# Patient Record
Sex: Female | Born: 1955 | Race: White | Hispanic: No | Marital: Married | Smoking: Former smoker
Health system: Southern US, Community
[De-identification: ages and names within clinical notes are randomized; demographics above are authoritative.]

## PROBLEM LIST (undated history)

## (undated) DIAGNOSIS — R51 Headache: Secondary | ICD-10-CM

## (undated) DIAGNOSIS — F419 Anxiety disorder, unspecified: Secondary | ICD-10-CM

## (undated) DIAGNOSIS — C801 Malignant (primary) neoplasm, unspecified: Secondary | ICD-10-CM

## (undated) DIAGNOSIS — G43909 Migraine, unspecified, not intractable, without status migrainosus: Secondary | ICD-10-CM

## (undated) DIAGNOSIS — F32A Depression, unspecified: Secondary | ICD-10-CM

## (undated) DIAGNOSIS — K760 Fatty (change of) liver, not elsewhere classified: Secondary | ICD-10-CM

## (undated) DIAGNOSIS — E46 Unspecified protein-calorie malnutrition: Secondary | ICD-10-CM

## (undated) DIAGNOSIS — F329 Major depressive disorder, single episode, unspecified: Secondary | ICD-10-CM

## (undated) DIAGNOSIS — I517 Cardiomegaly: Secondary | ICD-10-CM

## (undated) DIAGNOSIS — K222 Esophageal obstruction: Secondary | ICD-10-CM

## (undated) DIAGNOSIS — F319 Bipolar disorder, unspecified: Secondary | ICD-10-CM

## (undated) DIAGNOSIS — E785 Hyperlipidemia, unspecified: Secondary | ICD-10-CM

## (undated) HISTORY — DX: Depression, unspecified: F32.A

## (undated) HISTORY — DX: Anxiety disorder, unspecified: F41.9

## (undated) HISTORY — DX: Esophageal obstruction: K22.2

## (undated) HISTORY — DX: Unspecified protein-calorie malnutrition: E46

## (undated) HISTORY — DX: Migraine, unspecified, not intractable, without status migrainosus: G43.909

## (undated) HISTORY — PX: OTHER SURGICAL HISTORY: SHX169

## (undated) HISTORY — DX: Fatty (change of) liver, not elsewhere classified: K76.0

## (undated) HISTORY — DX: Major depressive disorder, single episode, unspecified: F32.9

## (undated) HISTORY — DX: Hyperlipidemia, unspecified: E78.5

## (undated) HISTORY — DX: Cardiomegaly: I51.7

## (undated) HISTORY — DX: Malignant (primary) neoplasm, unspecified: C80.1

## (undated) HISTORY — DX: Bipolar disorder, unspecified: F31.9

## (undated) HISTORY — DX: Headache: R51

---
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
## 1977-07-22 HISTORY — PX: CHOLECYSTECTOMY: SHX55

## 1986-07-22 HISTORY — PX: OTHER SURGICAL HISTORY: SHX169

## 1989-07-22 HISTORY — PX: ABDOMINAL HYSTERECTOMY: SHX81

## 1998-08-30 ENCOUNTER — Encounter: Payer: Self-pay | Admitting: Emergency Medicine

## 1998-08-30 ENCOUNTER — Inpatient Hospital Stay (HOSPITAL_COMMUNITY): Admission: EM | Admit: 1998-08-30 | Discharge: 1998-08-31 | Payer: Self-pay | Admitting: Emergency Medicine

## 1998-08-31 ENCOUNTER — Encounter: Payer: Self-pay | Admitting: Emergency Medicine

## 1999-09-12 ENCOUNTER — Emergency Department (HOSPITAL_COMMUNITY): Admission: EM | Admit: 1999-09-12 | Discharge: 1999-09-12 | Payer: Self-pay | Admitting: Emergency Medicine

## 2002-07-22 HISTORY — PX: TONSILLECTOMY AND ADENOIDECTOMY: SUR1326

## 2002-11-07 ENCOUNTER — Emergency Department (HOSPITAL_COMMUNITY): Admission: EM | Admit: 2002-11-07 | Discharge: 2002-11-07 | Payer: Self-pay

## 2002-11-07 ENCOUNTER — Encounter: Payer: Self-pay | Admitting: Emergency Medicine

## 2003-06-10 ENCOUNTER — Encounter (INDEPENDENT_AMBULATORY_CARE_PROVIDER_SITE_OTHER): Payer: Self-pay | Admitting: Specialist

## 2003-06-10 ENCOUNTER — Ambulatory Visit (HOSPITAL_COMMUNITY): Admission: RE | Admit: 2003-06-10 | Discharge: 2003-06-11 | Payer: Self-pay | Admitting: Otolaryngology

## 2005-08-22 DIAGNOSIS — M8580 Other specified disorders of bone density and structure, unspecified site: Secondary | ICD-10-CM | POA: Insufficient documentation

## 2007-07-30 ENCOUNTER — Ambulatory Visit: Payer: Self-pay | Admitting: Internal Medicine

## 2007-07-30 DIAGNOSIS — F329 Major depressive disorder, single episode, unspecified: Secondary | ICD-10-CM

## 2007-07-30 DIAGNOSIS — E785 Hyperlipidemia, unspecified: Secondary | ICD-10-CM

## 2007-07-30 DIAGNOSIS — I10 Essential (primary) hypertension: Secondary | ICD-10-CM | POA: Insufficient documentation

## 2007-07-30 DIAGNOSIS — J069 Acute upper respiratory infection, unspecified: Secondary | ICD-10-CM | POA: Insufficient documentation

## 2007-07-30 DIAGNOSIS — F411 Generalized anxiety disorder: Secondary | ICD-10-CM | POA: Insufficient documentation

## 2007-08-03 ENCOUNTER — Ambulatory Visit: Payer: Self-pay | Admitting: Internal Medicine

## 2007-08-07 ENCOUNTER — Telehealth: Payer: Self-pay | Admitting: Internal Medicine

## 2007-08-07 ENCOUNTER — Encounter (INDEPENDENT_AMBULATORY_CARE_PROVIDER_SITE_OTHER): Payer: Self-pay | Admitting: *Deleted

## 2007-08-12 ENCOUNTER — Telehealth: Payer: Self-pay | Admitting: Internal Medicine

## 2007-10-02 ENCOUNTER — Ambulatory Visit: Payer: Self-pay | Admitting: Internal Medicine

## 2007-10-02 DIAGNOSIS — Z9189 Other specified personal risk factors, not elsewhere classified: Secondary | ICD-10-CM

## 2007-10-04 ENCOUNTER — Encounter: Payer: Self-pay | Admitting: Internal Medicine

## 2007-10-08 ENCOUNTER — Encounter: Payer: Self-pay | Admitting: Internal Medicine

## 2008-08-11 ENCOUNTER — Telehealth: Payer: Self-pay | Admitting: Internal Medicine

## 2008-09-28 ENCOUNTER — Telehealth: Payer: Self-pay | Admitting: Internal Medicine

## 2008-11-19 DIAGNOSIS — G4733 Obstructive sleep apnea (adult) (pediatric): Secondary | ICD-10-CM | POA: Insufficient documentation

## 2009-03-22 HISTORY — PX: OTHER SURGICAL HISTORY: SHX169

## 2009-03-27 DIAGNOSIS — Z9884 Bariatric surgery status: Secondary | ICD-10-CM | POA: Insufficient documentation

## 2010-12-07 NOTE — Op Note (Signed)
NAME:  EDIN, SKARDA                           ACCOUNT NO.:  [id number]   MEDICAL RECORD NO.:  [id number]                   PATIENT TYPE:  OIB   LOCATION:  3310                                 FACILITY:  [facility]   PHYSICIAN:  Zola Button T. Lazarus Salines, M.D.              DATE OF BIRTH:  12/01/1955   DATE OF PROCEDURE:  06/10/2003  DATE OF DISCHARGE:  06/11/2003                                 OPERATIVE REPORT   PREOPERATIVE DIAGNOSES:  1. Nasal septal deviation.  2. Hypertrophic inferior turbinates with obstruction.  3. Cryptic tonsillitis, chronic.  4. Snoring.   POSTOPERATIVE DIAGNOSES:  1. Nasal septal deviation.  2. Hypertrophic inferior turbinates with obstruction.  3. Cryptic tonsillitis, chronic.  4. Snoring.  5. Concha bullosa with obstruction, right middle turbinate.   PROCEDURE:  1. Septoplasty.  2. Bilateral SMR inferior turbinates.  3. Partial excision right middle turbinate.  4. Tonsillectomy.  5. LAUP.   SURGEON:  Gloris Manchester. Lazarus Salines, M.D.   ANESTHESIA:  General orotracheal anesthesia.   ESTIMATED BLOOD LOSS:  25 mL.   COMPLICATIONS:  None.   FINDINGS:  A leftward septal deviation with a severe chondro ethmoid spur  abutting the inferior  turbinate. Left inferior turbinates bilaterally. 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 1+ embedded  fibrotic cryptic tonsils. A long, thin soft palate, especially posteriorly  pillars.   DESCRIPTION OF PROCEDURE:  With the patient in a comfortable supine  position, general orotracheal anesthesia was induced without difficulty. At  an appropriate level, the patient was turned away from anesthesia 90 degrees  and placed in a semi-sitting position. A saline moistened throat pack was  placed. The nasal vibrissae were trimmed.   Cocaine crystals, 200 mg total, were applied on cotton carriers to the  anterior ethmoid and sphenopalatine ganglion regions on both sides. Cocaine  solution, 160 mg total was  applied on 1/2 x 3 inch cottonoids to  both sides  of the septal mucosa. Then 1% Xylocaine with 1:100,000 solution of  epinephrine, 8 mL total was infiltrated in the submucoperichondrial plane of  the septum on both sides, into the membranous columella, into the anterior  floor of the nose, and into the nasal spine region. Several minutes were  allowed for hemostasis and anesthesia to take effect. A sterile preparation  and draping of the midface was accomplished.   The materials were removed from the nose and observed to be intact and  correct in number. The findings were as described above. A small floor  incision was sharply incised on both sides and a right hemitransfixion  incision was incised and communicated with the floor incision. Floor tunnels  were generated on both sides and brought up to the vomer  and carried  forward onto the maxillary crest. The submucoperichondrial plane of the  right septum was elevated up into the perpendicular part of the ethmoid and  brought down across the vomer and communicated with the floor tunnel and  brought forward.   The chondroethmoid junction was identified and opened with the caudal  elevator. The opposite submucoperiosteal plane of the septum was elevated.  The superior perpendicular plate was lysed with an open Jansen-Middleton  forceps. The inferior portion was dissected submucosally, rocked free and  delivered using the closed Jansen-Middleton forceps and also the [place]  forceps. A posterior tag of the quadrangular cartilage was removed. Bone  chips were carefully removed.   The posterior  inferior  corner of the quadrangular cartilage was incised as  well as a 2-mm strip along the inferior edge of the cartilage. This left a 2-  cm dorsal and caudal strut which was straight and with good support. The  maxillary crest posterior to the caudal strut was removed with mallet and  osteotome. Hemostasis was observed. The septum was able  to trapdoor freely  into the midline. It was secured to the nasal spine with a figure-of-8 4-0  PDS suture. The septal tunnel was carefully  suctioned free. The mucosal  incisions were closed with interrupted 4-0 chromic suture.   Just prior to completing the septoplasty, the inferior  turbinates were  infiltrated with 1% Xylocaine with 1:100,000 solution of epinephrine, 6 mL  total. After allowing several minutes for this to take effect, the anterior  hood of the right inferior turbinate was lysed. The medial mucosa was  incised in an anterior upsloping fashion. The laterally based flap was  developed. The turbinate was infractured. Using angled turbinate scissors,  the turbinate bone and lateral mucosa was resected in a posterior  downsloping fashion, taking virtually all of the anterior pole and leaving  virtually all of the posterior  pole. Bony chips were carefully  removed to  allow more prompt healing. The bulbous posterior  pole of the turbinate and  the cut mucosal edges were suction coagulated.   While doing the septoplasty, it was difficult to gain complete access to the  perpendicular plate, at which point, the conchal cell of the right middle  turbinate was discovered. The inferior edge of the turbinate was incised in  a saggital plane, and the lateral  bone and mucosa of the large turbinate  cell was removed using scissors and the open Jansen-Middleton forceps. The  turbinate was outfractured, which allowed better  access to the septum and  more  room in the nose. Hemostasis was spontaneous. Returning to the  inferior  turbinates, the left side was done identically to the right side.   Then 0.040 reinforced Silastic splints were fashioned and placed against the  septum on both sides and secured there too with a 3-0 nylon stitch. Double  thickness Neosporin impregnated Telfa packs were placed along the inferior turbinates on both sides. Then 6.5 mm nasal trumpets were  shortened to reach  into the nasopharynx and were placed, one on each side of the nose to allow  a postoperative airway and to serve as part of the septal packing. This  completed the nasal procedure. Hemostasis was observed.   At this point, the patient was placed flat and then placed in the  Trendelenburg position. Taking care to protect lips, teeth and endotracheal  tube, the Crowe-Davis mouth gag was introduced, expanded for visualization,  and suspended from the Mayo stand in the standard fashion. The findings were  as described above. Then 0.5% Marcaine with 1:200,000 epinephrine, 6 mL  total was infiltrated into the peritonsillar  planes on both sides and into  the soft palate for intraoperative hemostasis. Several minutes were allowed  for this to take effect. A palate retractor and mirror were used to  visualize the nasopharynx with packing visualized but no significant  residual  adenoids.   Beginning on the right side, the tonsil was grasped and retracted medially.  There was copious cryptic  debris. There was moderate fibrosis. Using the  cutting and coagulating cautery, the mucosa overlying the anterior and  superior  pole of the tonsil was coagulated and then cut down to the capsule  of the tonsil. Using the cautery tip as a blunt dissector, lysing fibrous  band and coagulating crossing vessels as identified, the tonsil was  dissected free of its muscular fossa from inferiorly upwards. The tonsil was  removed in its entirety as determined by examination of both tonsil and  fossa. A small additional quantity of cautery rendered the fossa hemostatic.  There was a moderately prominent vein in the midpole which was clamped with  a hemostat and ligated with a 4-0 chromic suture.   The mouth gag was repositioned for access to the left tonsil. The tonsil was  grasped and retracted  medially. Again the mucosa was coagulated and then  cut and the  tonsil was dissected from its  muscular  fossa the same  as on  the right side. This tonsil was slightly smaller and less fibrotic.  Hemostasis was observed.   Upon observing hemostasis in both tonsil beds, the LAUP was begun. All  personnel in the room had protective eye gear. The patient had saline  saturated eye pads placed under  her head  drape, and then  saline saturated  towels placed around  the mouth. The laser was previously tested for aim and  function.   Using the laser in a 10 watt continuous mode with a back drop, a beveled  incision was made along the axis of the uvula up into the soft palate, not  quite 2 cm on each side. The posterior  tonsillar pillar from this beveled  incision downward was resected on both sides. The neo uvula was amputated  approximately  half way up, leaving a longer posterior  flap and an anterior  chevron to allow approximation of the posterior mucosa forward. This was performed using a 4-0 chromic stitch. The same stitch was used along  the  sides of the uvula to reapproximate the mucosa. At this point the LAUP was  completed. The tonsil fossae were hemostatic.   The mouth gag was relaxed for several minutes. Upon reexpansion, hemostasis  was persistent. At this point the procedure was  completed. The  mouth gag  was relaxed and removed. The dental status was intact. The patient was  returned to anesthesia, awakened, extubated and transferred to recovery in  stable condition.   COMMENTS:  A 55 year old white female with a long history of poor nasal  breathing, history of recurrent tonsillitis, a past history of a  peritonsillar  abscess and  a current history of tonsillith debris  and  halitosis were the simple indications for today's procedure. Anticipated  routine postoperative recovery with attention to  analgesia, antibiosis,  hydration, ice and elevation. Will anticipate discharge in 24  hours. Will  remove  the nasal trumpets and  nasal packs prior to  discharge.  Gloris Manchester. Lazarus Salines, M.D.    KTW/MEDQ  D:  06/10/2003  T:  06/11/2003  Job:  [id number]   cc:   Meredith Staggers, M.D.  [address]  [place]  [place] [address]  Fax: [phone]

## 2011-02-17 ENCOUNTER — Emergency Department (HOSPITAL_COMMUNITY)
Admission: EM | Admit: 2011-02-17 | Discharge: 2011-02-17 | Disposition: A | Discharge status: Home / Self Care | Payer: No Typology Code available for payment source | Attending: Emergency Medicine | Admitting: Emergency Medicine

## 2011-02-17 ENCOUNTER — Emergency Department (HOSPITAL_COMMUNITY): Payer: No Typology Code available for payment source

## 2011-02-17 DIAGNOSIS — M79609 Pain in unspecified limb: Secondary | ICD-10-CM | POA: Insufficient documentation

## 2011-02-17 DIAGNOSIS — S5010XA Contusion of unspecified forearm, initial encounter: Secondary | ICD-10-CM | POA: Insufficient documentation

## 2011-02-17 DIAGNOSIS — W2209XA Striking against other stationary object, initial encounter: Secondary | ICD-10-CM | POA: Insufficient documentation

## 2011-02-17 DIAGNOSIS — Y9229 Other specified public building as the place of occurrence of the external cause: Secondary | ICD-10-CM | POA: Insufficient documentation

## 2011-03-08 ENCOUNTER — Emergency Department (HOSPITAL_COMMUNITY): Payer: BC Managed Care – PPO

## 2011-03-08 ENCOUNTER — Emergency Department (HOSPITAL_COMMUNITY)
Admission: EM | Admit: 2011-03-08 | Discharge: 2011-03-08 | Disposition: A | Discharge status: Home / Self Care | Payer: BC Managed Care – PPO | Attending: Emergency Medicine | Admitting: Emergency Medicine

## 2011-03-08 DIAGNOSIS — S1093XA Contusion of unspecified part of neck, initial encounter: Secondary | ICD-10-CM | POA: Insufficient documentation

## 2011-03-08 DIAGNOSIS — E7800 Pure hypercholesterolemia, unspecified: Secondary | ICD-10-CM | POA: Insufficient documentation

## 2011-03-08 DIAGNOSIS — M549 Dorsalgia, unspecified: Secondary | ICD-10-CM | POA: Insufficient documentation

## 2011-03-08 DIAGNOSIS — S0990XA Unspecified injury of head, initial encounter: Secondary | ICD-10-CM | POA: Insufficient documentation

## 2011-03-08 DIAGNOSIS — Z79899 Other long term (current) drug therapy: Secondary | ICD-10-CM | POA: Insufficient documentation

## 2011-03-08 DIAGNOSIS — W010XXA Fall on same level from slipping, tripping and stumbling without subsequent striking against object, initial encounter: Secondary | ICD-10-CM | POA: Insufficient documentation

## 2011-03-08 DIAGNOSIS — S0003XA Contusion of scalp, initial encounter: Secondary | ICD-10-CM | POA: Insufficient documentation

## 2011-03-08 DIAGNOSIS — Y92009 Unspecified place in unspecified non-institutional (private) residence as the place of occurrence of the external cause: Secondary | ICD-10-CM | POA: Insufficient documentation

## 2011-03-08 DIAGNOSIS — K219 Gastro-esophageal reflux disease without esophagitis: Secondary | ICD-10-CM | POA: Insufficient documentation

## 2011-05-16 DIAGNOSIS — R112 Nausea with vomiting, unspecified: Secondary | ICD-10-CM | POA: Insufficient documentation

## 2011-05-16 DIAGNOSIS — E52 Niacin deficiency [pellagra]: Secondary | ICD-10-CM | POA: Insufficient documentation

## 2011-09-11 LAB — HM MAMMOGRAPHY

## 2011-10-01 DIAGNOSIS — N63 Unspecified lump in unspecified breast: Secondary | ICD-10-CM | POA: Insufficient documentation

## 2012-04-30 ENCOUNTER — Encounter: Payer: Self-pay | Admitting: Family Medicine

## 2012-04-30 ENCOUNTER — Ambulatory Visit (INDEPENDENT_AMBULATORY_CARE_PROVIDER_SITE_OTHER): Payer: Self-pay | Admitting: Family Medicine

## 2012-04-30 VITALS — BP 112/68 | Temp 98.6°F | Ht 66.0 in | Wt 124.0 lb

## 2012-04-30 DIAGNOSIS — E785 Hyperlipidemia, unspecified: Secondary | ICD-10-CM

## 2012-04-30 DIAGNOSIS — Z9884 Bariatric surgery status: Secondary | ICD-10-CM

## 2012-04-30 DIAGNOSIS — K922 Gastrointestinal hemorrhage, unspecified: Secondary | ICD-10-CM

## 2012-04-30 DIAGNOSIS — F411 Generalized anxiety disorder: Secondary | ICD-10-CM

## 2012-04-30 DIAGNOSIS — R252 Cramp and spasm: Secondary | ICD-10-CM

## 2012-04-30 LAB — PHOSPHORUS: Phosphorus: 4 mg/dL (ref 2.3–4.6)

## 2012-04-30 LAB — LIPID PANEL
Total CHOL/HDL Ratio: 3
VLDL: 17.4 mg/dL (ref 0.0–40.0)

## 2012-04-30 LAB — COMPREHENSIVE METABOLIC PANEL
Alkaline Phosphatase: 53 U/L (ref 39–117)
BUN: 11 mg/dL (ref 6–23)
Glucose, Bld: 96 mg/dL (ref 70–99)
Total Bilirubin: 0.6 mg/dL (ref 0.3–1.2)

## 2012-04-30 LAB — CBC WITH DIFFERENTIAL/PLATELET
Basophils Relative: 0.5 % (ref 0.0–3.0)
Eosinophils Relative: 4.1 % (ref 0.0–5.0)
HCT: 36 % (ref 36.0–46.0)
Lymphs Abs: 1.3 10*3/uL (ref 0.7–4.0)
MCV: 90 fl (ref 78.0–100.0)
Monocytes Absolute: 0.3 10*3/uL (ref 0.1–1.0)
Neutro Abs: 2.2 10*3/uL (ref 1.4–7.7)
RBC: 4 Mil/uL (ref 3.87–5.11)
WBC: 4 10*3/uL — ABNORMAL LOW (ref 4.5–10.5)

## 2012-04-30 LAB — MAGNESIUM: Magnesium: 2.1 mg/dL (ref 1.5–2.5)

## 2012-04-30 LAB — FERRITIN: Ferritin: 11.7 ng/mL (ref 10.0–291.0)

## 2012-04-30 LAB — VITAMIN B12: Vitamin B-12: 665 pg/mL (ref 211–911)

## 2012-04-30 NOTE — Patient Instructions (Addendum)
-  We have ordered labs or studies at this visit. It usually takes 1-2 weeks for results and processing. We will contact you with instructions IF your results are abnormal. Normal results will be released to your [facility] in 1-2 weeks. If you have not heard from [place] or can not find your results in [facility] in 2 weeks please contact our office.  -please make appointment with me after visit with the gstroenterologist  Thank you for enrolling in MyChart. Please follow the instructions below to securely access your online medical record. MyChart allows you to send messages to your doctor, view your test results, renew your prescriptions, schedule appointments, and more.  How Do I Sign Up? 1. In your Internet browser, go to [email or web address] 2. Click on the New  User? link in the Sign In box.  3. Enter your MyChart Access Code exactly as it appears below. You will not need to use this code after you have completed the sign-up process. If you do not sign up before the expiration date, you must request a new code. MyChart Access Code: [email or web address] Expires: 05/30/2012  8:46 AM  4. Enter the last four digits of your Social Security Number (xxxx) and Date of Birth (mm/dd/yyyy) as indicated and click Next. You will be taken to the next sign-up page. 5. Create a MyChart ID. This will be your MyChart login ID and cannot be changed, so think of one that is secure and easy to remember. 6. Create a MyChart password. You can change your password at any time. 7. Enter your Password Reset Question and Answer and click Next. This can be used at a later time if you forget your password.  8. Select your communication preference, and if applicable enter your e-mail address. You will receive e-mail notification when new information is available in MyChart by choosing to receive e-mail notifications and filling in your e-mail. 9. Click Sign In. You can now view your medical record.   Additional  Information If you have questions, you can email [email or web address] or call [phone] to talk to our MyChart staff. Remember, MyChart is NOT to be used for urgent needs. For medical emergencies, dial 911.

## 2012-04-30 NOTE — Progress Notes (Signed)
Chief Complaint  Patient presents with  . Establish Care    HPI:  BRBPR: -started several months ago -mucus and blood from rectum every day for several months when wipes  -BRB in stools - normal stools, light brown, formed stools -never had a colonoscopy - had gastric bypass in 2010 and lost 150 lbs and now afraid she can not take bowel regimen to do colonoscopy Became anorexic and then on megace briefly earlier this year for a few weeks and gained 20 lbs - this prevented her having to go in hospital for feeding tube -denies fevers, chills, vomiting -has had nausea since surgery [facility] at [facility]) -has been several month since CBC has been checked Has severe osteoporosis - seeing rheum and getting IV reclast Having severe leg cramps for a long time and sometimes in hands - doesn't know what is causing this, these are occuring daily and started after her gastric bypass, she does worry that she is not absorbing something but reports extensive workup by last PCP. Worse aver activity. Cramps occur daily, sometimes feels need to move legs. Hands sometimes feel like they are drawing up. Was on statin and PPI in the past but stopped these one month ago and not better. Magnesium OTC seems to help a little. Voiced frustration with previous PCP. Sees a psychiatrist for anxiety. Has stopped most medications other then lorazepam to sleep. Sees gyn and had physical recently and normal. Had flu vaccine recently.   ROS: See pertinent positives and negatives per HPI.  Past Medical History  Diagnosis Date  . Depression   . Headache     frequent  . GERD (gastroesophageal reflux disease)   . Hyperlipidemia   . Migraine     Family History  Problem Relation Age of Onset  . Arthritis Mother   . Cancer Mother     breast  . Hyperlipidemia Mother   . Alcohol abuse Father   . Arthritis Father   . Heart disease Father   . Stroke Father   . Hypertension Father   . Cancer Maternal  Grandmother   . Arthritis Maternal Grandfather   . Hyperlipidemia Maternal Grandfather   . Alcohol abuse Paternal Grandfather     History   Social History  . Marital Status: Married    Spouse Name: N/A    Number of Children: N/A  . Years of Education: N/A   Social History Main Topics  . Smoking status: None  . Smokeless tobacco: None  . Alcohol Use:   . Drug Use:   . Sexually Active:    Other Topics Concern  . None   Social History Narrative  . None    Current outpatient prescriptions:FLUoxetine HCl 60 MG TABS, Take by mouth every 7 (seven) days., Disp: , Rfl: ;  LORazepam (ATIVAN) 2 MG tablet, Take 2 mg by mouth every 6 (six) hours as needed., Disp: , Rfl:   EXAM:  Filed Vitals:   04/30/12 0809  BP: 112/68  Temp: 98.6 F (37 C)    Body mass index is 20.01 kg/(m^2).  GENERAL: vitals reviewed and listed above, alert, oriented, appears well hydrated and in no acute distress  PSYCH: somewhat anxious and seems frustration  ASSESSMENT AND PLAN:  Discussed the following assessment and plan:  1. GI bleeding  CBC with Differential, CMP, Magnesium, Phosphorus, Ambulatory referral to Gastroenterology  2. Cramps, extremity  CK, Vitamin D, 25-hydroxy  3. HYPERLIPIDEMIA  Lipid Panel  4. ANXIETY    5. Gastric bypass  status for obesity  CBC with Differential, CMP, Magnesium, Phosphorus, Vitamin B12, Ferritin   Complicated patient with multiple problems. Will obtain records. Referred to GI for hematochezia. Labs today per orders. If labs do not uncover obvious reason for muscle cramps also advised ABIs, but pt would prefer to wait until sees GI. Do suspect given extensive recent weight loss and GI issues cramps may be related to malabsorption. Advised to notify bariatric surgeon. Follow up with me after visit with GI.  Patient Instructions  -We have ordered labs or studies at this visit. It usually takes 1-2 weeks for results and processing. We will contact you with  instructions IF your results are abnormal. Normal results will be released to your [facility] in 1-2 weeks. If you have not heard from [place] or can not find your results in [facility] in 2 weeks please contact our office.  -please make appointment with me after visit with the gstroenterologist  Thank you for enrolling in MyChart. Please follow the instructions below to securely access your online medical record. MyChart allows you to send messages to your doctor, view your test results, renew your prescriptions, schedule appointments, and more.  How Do I Sign Up? 1. In your Internet browser, go to [email or web address] 2. Click on the New  User? link in the Sign In box.  3. Enter your MyChart Access Code exactly as it appears below. You will not need to use this code after you have completed the sign-up process. If you do not sign up before the expiration date, you must request a new code. MyChart Access Code: [email or web address] Expires: 05/30/2012  8:46 AM  4. Enter the last four digits of your Social Security Number (xxxx) and Date of Birth (mm/dd/yyyy) as indicated and click Next. You will be taken to the next sign-up page. 5. Create a MyChart ID. This will be your MyChart login ID and cannot be changed, so think of one that is secure and easy to remember. 6. Create a MyChart password. You can change your password at any time. 7. Enter your Password Reset Question and Answer and click Next. This can be used at a later time if you forget your password.  8. Select your communication preference, and if applicable enter your e-mail address. You will receive e-mail notification when new information is available in MyChart by choosing to receive e-mail notifications and filling in your e-mail. 9. Click Sign In. You can now view your medical record.   Additional Information If you have questions, you can email [email or web address] or call [phone] to talk to our MyChart staff.  Remember, MyChart is NOT to be used for urgent needs. For medical emergencies, dial 911.       Kriste Basque R.

## 2012-05-01 LAB — VITAMIN D 25 HYDROXY (VIT D DEFICIENCY, FRACTURES): Vit D, 25-Hydroxy: 31 ng/mL (ref 30–89)

## 2012-05-01 NOTE — Progress Notes (Signed)
Quick Note:  Called and spoke with pt and pt is aware. Pt request labs. Labs mailed to verified address. ______

## 2012-05-05 ENCOUNTER — Telehealth: Payer: Self-pay | Admitting: Family Medicine

## 2012-05-05 MED ORDER — CYCLOBENZAPRINE HCL 5 MG PO TABS: ORAL_TABLET | ORAL | Status: DC

## 2012-05-05 NOTE — Telephone Encounter (Signed)
Pls advise.  

## 2012-05-05 NOTE — Telephone Encounter (Signed)
Caller: Jaysha/Patient; Phone: [phone]; Reason for Call: Patient states she was seen by Dr.  Selena Batten the other day, wants to see if Dr.  Selena Batten would call in something for muscle spasms in her hands & legs.  Please call patient back.  Thanks

## 2012-05-05 NOTE — Telephone Encounter (Signed)
Will send flexeril to pharmacy for temporary use when severe. If continued cramps despite ferritin use, I would recommend she follow up with me to discuss other options.

## 2012-05-05 NOTE — Telephone Encounter (Signed)
Caller: Sharline/Patient; Patient Name: Misty Howard; PCP: Kriste Basque [facility]); Best Callback Phone Number: [phone] Patient is calling about had called office earlier 05/05/12 wanting to know if Dr. Selena Batten would call in something for muscle spasms of hands and legs and had not heard anything from office yet.   Advised patient that note is in the computer, doctors are still seeing patients, and it will be addressed when done with patients for the day.

## 2012-05-06 ENCOUNTER — Telehealth: Payer: Self-pay | Admitting: Family Medicine

## 2012-05-06 NOTE — Telephone Encounter (Signed)
Called pt and advised of appt with Dr. Logan Bores at [facility] on Dec. 6.2013 at 10:30 am.  Pt given address of [address]  [place] [address] [address] [facility].  Pt given telephone information as well.

## 2012-05-06 NOTE — Telephone Encounter (Signed)
Note duplicated message.

## 2012-05-06 NOTE — Telephone Encounter (Signed)
Called pt to make aware and pt states she is very upset.  Pt states that she received a call last night that her Flexeril had been called in.  Advised pt of Dr. Elmyra Ricks recommendations and pt is aware.  Pt states the other option is a referral to GI.  Called Joni Reining and checked on referral.  Per Joni Reining referral was sent to [facility].  Asked Joni Reining to call and check the status of pt getting an appt.  Advised pt she would receive a call back today on status.

## 2012-05-06 NOTE — Telephone Encounter (Signed)
Caller: Ricci/Patient; Patient Name: Misty Howard; PCP: Kriste Basque [facility]); Best Callback Phone Number: [phone].  Pt calling back today 05/06/12 regarding previous calls from yesterday.  Pt very upset because nobody from office ever called her back.  Triager pulled chart in EPIC and reviewed note with pt from Dr. Selena Batten written yesterday at 5:31 PM, "Will send flexeril to pharmacy for temporary use when severe. If continued cramps despite ferritin use, I would recommend she follow up with me to discuss other options."  Triager advised pt that MD called in Flexeril to [facility].  Pt very upset that nobody from office bothered to call her back to tell her this was called in.

## 2012-05-14 ENCOUNTER — Encounter: Payer: Self-pay | Admitting: Family Medicine

## 2012-05-18 ENCOUNTER — Encounter: Payer: Self-pay | Admitting: Family Medicine

## 2012-05-18 DIAGNOSIS — M810 Age-related osteoporosis without current pathological fracture: Secondary | ICD-10-CM | POA: Insufficient documentation

## 2012-05-18 DIAGNOSIS — Z8659 Personal history of other mental and behavioral disorders: Secondary | ICD-10-CM | POA: Insufficient documentation

## 2012-05-18 NOTE — Progress Notes (Signed)
Records reviewed from previous PCP. PMH, Problem list and Health maintenance updated. Of note at OV 10/2011, concern for anorexia per notes. Work up for cramps with extensive lab work and felt to be related to [employer].  Referred by previous PCP to GI in 12/2011.

## 2012-05-25 ENCOUNTER — Telehealth: Payer: Self-pay | Admitting: Family Medicine

## 2012-05-25 NOTE — Telephone Encounter (Signed)
Patient calling back.  Continues to have cramping in her hands and feet.  She completed the Flexoril given with no improvement.  States that she wakes up at night and walks the floor and crys in the pain.  States that her hands /fingers twist.   She has been scheduled to see a GI specialist, Dec 6 appt date.   Wanting to know what the next plan of treatment will be?

## 2012-05-25 NOTE — Telephone Encounter (Signed)
Would advise appointment for this.

## 2012-05-25 NOTE — Telephone Encounter (Signed)
Pls advise.  

## 2012-05-26 NOTE — Telephone Encounter (Signed)
Left a message for return call.  

## 2012-05-26 NOTE — Telephone Encounter (Signed)
Pt is sch for tomorrow °

## 2012-05-27 ENCOUNTER — Encounter: Payer: Self-pay | Admitting: Family Medicine

## 2012-05-27 ENCOUNTER — Ambulatory Visit (INDEPENDENT_AMBULATORY_CARE_PROVIDER_SITE_OTHER): Payer: BC Managed Care – PPO | Admitting: Family Medicine

## 2012-05-27 VITALS — BP 108/78 | HR 101 | Temp 98.4°F | Wt 125.0 lb

## 2012-05-27 DIAGNOSIS — R252 Cramp and spasm: Secondary | ICD-10-CM

## 2012-05-27 NOTE — Patient Instructions (Signed)
-  it will be very important to increase hydration - 2 large bottle of watered down Gatorade daily at minimum  -heat and then sustained stretching every evening before bed  -regular sustained exercise - 20- 30 minutes daily  -1000mg  calcium and 1000IU vitamin D daily  -continue the iron and vitamin C  -continue to make sure you are eating well  -follow up in one month

## 2012-05-27 NOTE — Progress Notes (Signed)
Chief Complaint  Patient presents with  . cramping in hands and feet    HPI:  Cramps in hands and feet: -started about 1 year after gastric bypass surgery -had had lab workups in the past and has been felt to be malnutrition as pt los a very large amount of weight -LFTs, calcium, magnesium and potassium are normal -patient repots TSH done recently and normal -her ferritin is low and she is mildly anemic and is scheduled to see GI for LGI bleeding (bleeding has improved) -description of symptoms: only occur at rest and almost always at night, occurs in hands and in feet and calves - occurs usually about daily, muscles draw up and then it takes a  Long time of stretching to get cramp to release -diet:yesterday: yogurt for breakfast, [employer] chicken wrap, frozen dinner ([employer] [place]) - she is trying to eat well and weight is now up to 125 (gastric surgeon follows her closely and wants her to be at 130lbs) -B vitaimins: taking B12 -does not drink much, hates to have to pee -taking: taking the vitamin C and iron as instructed -is taking reclast for severe osteoporosis, Dr. Armanda Magic  - but thinks started this medication after cramps started She has tried:   ROS: See pertinent positives and negatives per HPI.  Past Medical History  Diagnosis Date  . Depression   . Headache     frequent  . GERD (gastroesophageal reflux disease)   . Hyperlipidemia   . Migraine   . Bipolar disorder   . OSA (obstructive sleep apnea)   . Gastric ulcer   . Hepatic steatosis   . Anxiety   . Esophageal ring     s/p dilation 2003  . Protein calorie malnutrition   . LAE (left atrial enlargement)     per records on echo in 2008    Family History  Problem Relation Age of Onset  . Arthritis Mother   . Cancer Mother     breast  . Hyperlipidemia Mother   . Alcohol abuse Father   . Arthritis Father   . Heart disease Father   . Stroke Father   . Hypertension Father   . Cancer Maternal  Grandmother   . Arthritis Maternal Grandfather   . Hyperlipidemia Maternal Grandfather   . Alcohol abuse Paternal Grandfather     History   Social History  . Marital Status: Married    Spouse Name: N/A    Number of Children: N/A  . Years of Education: N/A   Social History Main Topics  . Smoking status: Former Games developer  . Smokeless tobacco: None  . Alcohol Use: Yes  . Drug Use: None  . Sexually Active: None   Other Topics Concern  . None   Social History Narrative  . None    Current outpatient prescriptions:cyclobenzaprine (FLEXERIL) 5 MG tablet, Can use one tablet up to twice daily only if needed for back spasms., Disp: 30 tablet, Rfl: 0;  ferrous fumarate (HEMOCYTE - 106 MG FE) 325 (106 FE) MG TABS, Take 1 tablet by mouth., Disp: , Rfl: ;  FLUoxetine HCl 60 MG TABS, Take by mouth every 7 (seven) days., Disp: , Rfl: ;  LORazepam (ATIVAN) 2 MG tablet, Take 2 mg by mouth every 6 (six) hours as needed., Disp: , Rfl:  magnesium 30 MG tablet, Take 30 mg by mouth 2 (two) times daily., Disp: , Rfl: ;  Multiple Vitamin (MULTIVITAMIN) tablet, Take 1 tablet by mouth daily., Disp: , Rfl: ;  promethazine (PHENERGAN) 25 MG tablet, Take 25 mg by mouth every 6 (six) hours as needed., Disp: , Rfl: ;  vitamin B-12 (CYANOCOBALAMIN) 250 MCG tablet, Take 250 mcg by mouth daily., Disp: , Rfl:   EXAM:  Filed Vitals:   05/27/12 0805  BP: 108/78  Pulse: 101  Temp: 98.4 F (36.9 C)    There is no height on file to calculate BMI.  GENERAL: vitals reviewed and listed above, alert, oriented, appears well hydrated and in no acute distress  HEENT: atraumatic, conjunttiva clear, no obvious abnormalities on inspection of external nose and ears  NECK: no obvious masses on inspection  LUNGS: clear to auscultation bilaterally, no wheezes, rales or rhonchi, good air movement  CV: HRRR, no peripheral edema  MS/NEURO: moves all extremities without noticeable abnormality, scars on R UE from extensive  prior surgery, normal muscle strength, DTRs and sensation to light touch throughout, no atrophy or spasm of muscles, good pulses throughout  PSYCH: pleasant and cooperative, no obvious depression or anxiety  ASSESSMENT AND PLAN:  Discussed the following assessment and plan:  1. Nocturnal muscle cramps    -longstanding resting muscle cramps throughout extremities with numerous possible etiologies though likely related to poor nutrition or electrolyte abnormalities as muscle tissue level and dehydration as pt does not drink enough fluids -I also advised ensuring adequate hydration and stressed the importance of proper nutrition and regular exercise and stretching and heat -I also think adding calcium 1000mg  daily may be helpful and vitamin D 1000 IU daily is worth trying -we discussed quinine, and tonic water before bedtime may be useful -we discussed doing EMG or a neurology referral, but pt refuses at this time -reclast can cause severe muscle pain and spasm and this could be contributing or the cause - i have advised her to talk with her rheumatologist -follow up in 1 month  -Patient advised to return or notify a doctor immediately if symptoms worsen or persist or new concerns arise.  Patient Instructions  -it will be very important to increase hydration - 2 large bottle of watered down Gatorade daily at minimum  -heat and then sustained stretching every evening before bed  -regular sustained exercise - 20- 30 minutes daily  -1000mg  calcium and 1000IU vitamin D daily  -continue the iron and vitamin C  -continue to make sure you are eating well  -follow up in one month     KIM, HANNAH R.

## 2012-06-29 ENCOUNTER — Ambulatory Visit: Payer: BC Managed Care – PPO | Admitting: Family Medicine

## 2012-10-16 DIAGNOSIS — K5900 Constipation, unspecified: Secondary | ICD-10-CM | POA: Insufficient documentation

## 2013-02-01 ENCOUNTER — Telehealth: Payer: Self-pay | Admitting: Family Medicine

## 2013-02-01 DIAGNOSIS — R252 Cramp and spasm: Secondary | ICD-10-CM

## 2013-02-01 NOTE — Telephone Encounter (Signed)
pls advise

## 2013-02-01 NOTE — Telephone Encounter (Signed)
Misty Howard S/patient Phone: [phone].  Called to ask if Dr Selena Batten would order Neurontin 300 mg for intermittent leg cramps with toe contractions. Declined triage.  Reports MD ran all sorts of tests in October 2013; tried Calcium, Vit C and iron. Under care of MD at [facility] for colon cancer.  Tried Neurontin for 3 nights and it  worked well. Declined to say how she got the medication.  History of gastric bypass; wants to have MD confirm that medication will be safe to use. [employer] in [employer] [phone].

## 2013-02-02 MED ORDER — GABAPENTIN 300 MG PO CAPS: 300.0000 mg | ORAL_CAPSULE | Freq: Every evening | ORAL | Status: DC | PRN

## 2013-02-02 NOTE — Telephone Encounter (Signed)
Already had placed order.

## 2013-02-02 NOTE — Telephone Encounter (Signed)
Called and spoke with pt and pt is aware.  Pls give qty and refills please.  Thanks!

## 2013-02-02 NOTE — Telephone Encounter (Signed)
Ok  To try this once nightly when cramps bed. But, this medication can also CAUSE leg cramps. Would advise she see neurology for ongoing cramps. Please ask if she would like this referral. Should talk with pharmacist about side effects and adverse reactions associated with this medication.

## 2013-04-08 ENCOUNTER — Telehealth: Payer: Self-pay | Admitting: Family Medicine

## 2013-04-08 DIAGNOSIS — R252 Cramp and spasm: Secondary | ICD-10-CM

## 2013-04-08 MED ORDER — GABAPENTIN 300 MG PO CAPS: 300.0000 mg | ORAL_CAPSULE | Freq: Every evening | ORAL | Status: DC | PRN

## 2013-04-08 NOTE — Telephone Encounter (Signed)
Samples request

## 2013-04-08 NOTE — Telephone Encounter (Signed)
Rx sent to pharmacy   

## 2013-04-08 NOTE — Telephone Encounter (Signed)
Unfortunately our office does not have samples of neurotin.  Pt would like a refill sent to [facility]. Pls advise.

## 2013-04-08 NOTE — Telephone Encounter (Signed)
Fine to refill for 6 months

## 2013-04-08 NOTE — Telephone Encounter (Signed)
Patient Information:  Caller Name: [name]  Phone: [phone]  Patient: Misty Howard, Misty Howard  Gender: Female  DOB: March 20, 1956  Age: 57 Years  PCP: Kriste Basque [facility])  Office Follow Up:  Does the office need to follow up with this patient?: Yes  Instructions For The Office: Pt requesting 2 more refills for her Neurontin.   Symptoms  Reason For Call & Symptoms: Pt is taking Neurontin 300 mg 1 QD for leg pain at night .  She states this is working very well for her and she would like to get 2 refills.  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  She has about 6 pills left from second refill from RX written 02/02/13.  ANY SAMPLES WOULD BE GREATLY APPRECIATED.   Reviewed Health History In EMR: N/A  Reviewed Medications In EMR: N/A  Reviewed Allergies In EMR: N/A  Reviewed Surgeries / Procedures: Yes  Date of Onset of Symptoms: Unknown  Treatments Tried: Neurontin 300 mg 1 PO QD @ HS  Treatments Tried Worked: Yes  Guideline(s) Used:  No Protocol Available - Information Only  Disposition Per Guideline:   Home Care  Reason For Disposition Reached:   Information only question and nurse able to answer  Advice Given:  N/A  Patient Will Follow Care Advice:  YES - ANY SAMPLES WOULD BE GREATLY APPRECIATED.

## 2013-04-08 NOTE — Addendum Note (Signed)
Addended by: Azucena Freed on: 04/08/2013 02:17 PM   Modules accepted: Orders

## 2013-12-31 ENCOUNTER — Emergency Department: Payer: Self-pay | Admitting: Emergency Medicine

## 2014-04-26 ENCOUNTER — Other Ambulatory Visit: Payer: Self-pay | Admitting: Family Medicine

## 2015-12-19 ENCOUNTER — Emergency Department: Payer: Worker's Compensation

## 2015-12-19 ENCOUNTER — Encounter: Payer: Self-pay | Admitting: Emergency Medicine

## 2015-12-19 ENCOUNTER — Emergency Department
Admission: EM | Admit: 2015-12-19 | Discharge: 2015-12-19 | Disposition: A | Discharge status: Home / Self Care | Payer: Worker's Compensation | Attending: Emergency Medicine | Admitting: Emergency Medicine

## 2015-12-19 DIAGNOSIS — Z87891 Personal history of nicotine dependence: Secondary | ICD-10-CM | POA: Insufficient documentation

## 2015-12-19 DIAGNOSIS — Y92129 Unspecified place in nursing home as the place of occurrence of the external cause: Secondary | ICD-10-CM | POA: Insufficient documentation

## 2015-12-19 DIAGNOSIS — Y999 Unspecified external cause status: Secondary | ICD-10-CM | POA: Insufficient documentation

## 2015-12-19 DIAGNOSIS — W503XXA Accidental bite by another person, initial encounter: Secondary | ICD-10-CM | POA: Diagnosis not present

## 2015-12-19 DIAGNOSIS — F329 Major depressive disorder, single episode, unspecified: Secondary | ICD-10-CM | POA: Insufficient documentation

## 2015-12-19 DIAGNOSIS — S61259A Open bite of unspecified finger without damage to nail, initial encounter: Secondary | ICD-10-CM

## 2015-12-19 DIAGNOSIS — Y939 Activity, unspecified: Secondary | ICD-10-CM | POA: Diagnosis not present

## 2015-12-19 DIAGNOSIS — S61251A Open bite of left index finger without damage to nail, initial encounter: Secondary | ICD-10-CM | POA: Insufficient documentation

## 2015-12-19 DIAGNOSIS — E785 Hyperlipidemia, unspecified: Secondary | ICD-10-CM | POA: Insufficient documentation

## 2015-12-19 DIAGNOSIS — Z79899 Other long term (current) drug therapy: Secondary | ICD-10-CM | POA: Insufficient documentation

## 2015-12-19 MED ORDER — TETANUS-DIPHTH-ACELL PERTUSSIS 5-2.5-18.5 LF-MCG/0.5 IM SUSP
0.5000 mL | Freq: Once | INTRAMUSCULAR | Status: AC
  Administered 2015-12-19: 0.5 mL via INTRAMUSCULAR

## 2015-12-19 MED ORDER — AMOXICILLIN-POT CLAVULANATE 875-125 MG PO TABS: 1.0000 | ORAL_TABLET | Freq: Two times a day (BID) | ORAL | Status: AC
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
## 2015-12-19 NOTE — ED Notes (Signed)
WC completed and delivered to lab for courier p/u. 
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
## 2015-12-19 NOTE — ED Notes (Signed)
Pt placed on med hold at this time, pt made aware and verbalized understanding at this time.
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
## 2015-12-21 DIAGNOSIS — K56699 Other intestinal obstruction unspecified as to partial versus complete obstruction: Secondary | ICD-10-CM | POA: Insufficient documentation

## 2015-12-21 DIAGNOSIS — K9189 Other postprocedural complications and disorders of digestive system: Secondary | ICD-10-CM | POA: Insufficient documentation

## 2018-04-07 DIAGNOSIS — E46 Unspecified protein-calorie malnutrition: Secondary | ICD-10-CM | POA: Insufficient documentation

## 2018-04-07 DIAGNOSIS — E860 Dehydration: Secondary | ICD-10-CM | POA: Insufficient documentation

## 2018-04-11 ENCOUNTER — Encounter: Payer: Self-pay | Admitting: Behavioral Health

## 2018-04-11 DIAGNOSIS — Z9884 Bariatric surgery status: Secondary | ICD-10-CM | POA: Insufficient documentation

## 2018-04-11 DIAGNOSIS — G43909 Migraine, unspecified, not intractable, without status migrainosus: Secondary | ICD-10-CM | POA: Insufficient documentation

## 2018-04-11 DIAGNOSIS — C189 Malignant neoplasm of colon, unspecified: Secondary | ICD-10-CM | POA: Insufficient documentation

## 2018-04-28 ENCOUNTER — Ambulatory Visit: Payer: Self-pay | Admitting: Psychiatry

## 2018-05-18 ENCOUNTER — Other Ambulatory Visit: Payer: Self-pay

## 2018-05-18 MED ORDER — FLUOXETINE HCL 20 MG PO CAPS: 60.0000 mg | ORAL_CAPSULE | Freq: Every day | ORAL | 0 refills | Status: DC

## 2018-06-09 ENCOUNTER — Other Ambulatory Visit: Payer: Self-pay

## 2018-06-09 MED ORDER — ARIPIPRAZOLE 2 MG PO TABS: 2.0000 mg | ORAL_TABLET | Freq: Every day | ORAL | 0 refills | Status: DC

## 2018-06-11 ENCOUNTER — Other Ambulatory Visit: Payer: Self-pay

## 2018-06-15 ENCOUNTER — Other Ambulatory Visit: Payer: Self-pay

## 2018-06-15 MED ORDER — FLUOXETINE HCL 20 MG PO CAPS: 60.0000 mg | ORAL_CAPSULE | Freq: Every day | ORAL | 0 refills | Status: DC

## 2018-07-29 ENCOUNTER — Telehealth: Payer: Self-pay | Admitting: Psychiatry

## 2018-07-29 NOTE — Telephone Encounter (Signed)
PATIENT WENT TO PHARMACY TO PICK UP ADIVANT AND DID NOT HAVE A REFILL WANTS TO KNOW IF SHE CAN JUST GET ENOUGH FOR TONIGHT, PLEASE SEND TO [place]

## 2018-07-29 NOTE — Telephone Encounter (Signed)
Lorazepam 1mg  #10 called into [place] till appt 07/31/2018

## 2018-07-31 ENCOUNTER — Ambulatory Visit (INDEPENDENT_AMBULATORY_CARE_PROVIDER_SITE_OTHER): Payer: BLUE CROSS/BLUE SHIELD | Admitting: Psychiatry

## 2018-07-31 ENCOUNTER — Encounter: Payer: Self-pay | Admitting: Psychiatry

## 2018-07-31 VITALS — BP 137/76 | HR 83

## 2018-07-31 DIAGNOSIS — F5105 Insomnia due to other mental disorder: Secondary | ICD-10-CM

## 2018-07-31 DIAGNOSIS — F39 Unspecified mood [affective] disorder: Secondary | ICD-10-CM

## 2018-07-31 DIAGNOSIS — F411 Generalized anxiety disorder: Secondary | ICD-10-CM

## 2018-07-31 DIAGNOSIS — F99 Mental disorder, not otherwise specified: Secondary | ICD-10-CM | POA: Diagnosis not present

## 2018-07-31 MED ORDER — LORAZEPAM 1 MG PO TABS: 2.5000 mg | ORAL_TABLET | Freq: Every day | ORAL | 5 refills | Status: DC

## 2018-07-31 MED ORDER — FLUOXETINE HCL 20 MG PO CAPS: 60.0000 mg | ORAL_CAPSULE | Freq: Every day | ORAL | 1 refills | Status: DC

## 2018-07-31 MED ORDER — ARIPIPRAZOLE 2 MG PO TABS: 2.0000 mg | ORAL_TABLET | Freq: Every day | ORAL | 1 refills | Status: DC

## 2018-07-31 MED ORDER — TOPIRAMATE 100 MG PO TABS: 200.0000 mg | ORAL_TABLET | Freq: Every day | ORAL | 1 refills | Status: DC
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
## 2018-09-10 ENCOUNTER — Telehealth: Payer: Self-pay | Admitting: Psychiatry

## 2018-09-10 NOTE — Telephone Encounter (Signed)
Nava called to says she is about to take a job in [place] that will last about 3 months.  Please call to discuss medicine refills while she is gone.  She does have an appt to f/u in July.

## 2018-09-11 NOTE — Telephone Encounter (Signed)
Pt aware and instructed to call with any problems or concerns.  Asking to have rx sent locally to [facility] to have on file.

## 2018-10-05 ENCOUNTER — Other Ambulatory Visit: Payer: Self-pay | Admitting: Psychiatry

## 2018-10-05 DIAGNOSIS — F5105 Insomnia due to other mental disorder: Secondary | ICD-10-CM

## 2018-10-05 DIAGNOSIS — F99 Mental disorder, not otherwise specified: Principal | ICD-10-CM

## 2018-10-05 MED ORDER — LORAZEPAM 1 MG PO TABS: 2.5000 mg | ORAL_TABLET | Freq: Every day | ORAL | 1 refills | Status: DC

## 2018-10-05 NOTE — Telephone Encounter (Signed)
Misty Howard called requesting refill on her lorazepam.  She is out of state for the next 3 months.  Returns about 12/12/18.  Please send to [facility] in [place], [place].  Phone [phone] or Fax [phone].  Her next appt is 02/08/19

## 2019-01-21 ENCOUNTER — Inpatient Hospital Stay (HOSPITAL_COMMUNITY)
Admission: EM | Admit: 2019-01-21 | Discharge: 2019-01-25 | DRG: 482 | Disposition: A | Discharge status: Home Health | Payer: 59 | Attending: Family Medicine | Admitting: Family Medicine

## 2019-01-21 ENCOUNTER — Emergency Department (HOSPITAL_COMMUNITY): Payer: 59

## 2019-01-21 ENCOUNTER — Encounter (HOSPITAL_COMMUNITY): Payer: Self-pay | Admitting: Emergency Medicine

## 2019-01-21 ENCOUNTER — Other Ambulatory Visit: Payer: Self-pay

## 2019-01-21 DIAGNOSIS — S72145A Nondisplaced intertrochanteric fracture of left femur, initial encounter for closed fracture: Principal | ICD-10-CM

## 2019-01-21 DIAGNOSIS — W109XXA Fall (on) (from) unspecified stairs and steps, initial encounter: Secondary | ICD-10-CM | POA: Diagnosis present

## 2019-01-21 DIAGNOSIS — F319 Bipolar disorder, unspecified: Secondary | ICD-10-CM | POA: Diagnosis present

## 2019-01-21 DIAGNOSIS — E785 Hyperlipidemia, unspecified: Secondary | ICD-10-CM | POA: Diagnosis present

## 2019-01-21 DIAGNOSIS — Z886 Allergy status to analgesic agent status: Secondary | ICD-10-CM

## 2019-01-21 DIAGNOSIS — Z885 Allergy status to narcotic agent status: Secondary | ICD-10-CM

## 2019-01-21 DIAGNOSIS — F4310 Post-traumatic stress disorder, unspecified: Secondary | ICD-10-CM | POA: Diagnosis present

## 2019-01-21 DIAGNOSIS — S72002A Fracture of unspecified part of neck of left femur, initial encounter for closed fracture: Secondary | ICD-10-CM

## 2019-01-21 DIAGNOSIS — S72142A Displaced intertrochanteric fracture of left femur, initial encounter for closed fracture: Secondary | ICD-10-CM

## 2019-01-21 DIAGNOSIS — Z87891 Personal history of nicotine dependence: Secondary | ICD-10-CM

## 2019-01-21 DIAGNOSIS — K760 Fatty (change of) liver, not elsewhere classified: Secondary | ICD-10-CM | POA: Diagnosis present

## 2019-01-21 DIAGNOSIS — Z8249 Family history of ischemic heart disease and other diseases of the circulatory system: Secondary | ICD-10-CM

## 2019-01-21 DIAGNOSIS — I10 Essential (primary) hypertension: Secondary | ICD-10-CM | POA: Diagnosis present

## 2019-01-21 DIAGNOSIS — Z888 Allergy status to other drugs, medicaments and biological substances status: Secondary | ICD-10-CM

## 2019-01-21 DIAGNOSIS — Z803 Family history of malignant neoplasm of breast: Secondary | ICD-10-CM

## 2019-01-21 DIAGNOSIS — D649 Anemia, unspecified: Secondary | ICD-10-CM | POA: Diagnosis present

## 2019-01-21 DIAGNOSIS — F32A Depression, unspecified: Secondary | ICD-10-CM | POA: Diagnosis present

## 2019-01-21 DIAGNOSIS — Z811 Family history of alcohol abuse and dependence: Secondary | ICD-10-CM

## 2019-01-21 DIAGNOSIS — Z8261 Family history of arthritis: Secondary | ICD-10-CM

## 2019-01-21 DIAGNOSIS — Z8349 Family history of other endocrine, nutritional and metabolic diseases: Secondary | ICD-10-CM

## 2019-01-21 DIAGNOSIS — S72009A Fracture of unspecified part of neck of unspecified femur, initial encounter for closed fracture: Secondary | ICD-10-CM | POA: Diagnosis not present

## 2019-01-21 DIAGNOSIS — Z1159 Encounter for screening for other viral diseases: Secondary | ICD-10-CM

## 2019-01-21 DIAGNOSIS — Z818 Family history of other mental and behavioral disorders: Secondary | ICD-10-CM

## 2019-01-21 DIAGNOSIS — G43909 Migraine, unspecified, not intractable, without status migrainosus: Secondary | ICD-10-CM | POA: Diagnosis present

## 2019-01-21 DIAGNOSIS — Z9884 Bariatric surgery status: Secondary | ICD-10-CM

## 2019-01-21 DIAGNOSIS — Z823 Family history of stroke: Secondary | ICD-10-CM

## 2019-01-21 DIAGNOSIS — F329 Major depressive disorder, single episode, unspecified: Secondary | ICD-10-CM | POA: Diagnosis present

## 2019-01-21 DIAGNOSIS — Z01818 Encounter for other preprocedural examination: Secondary | ICD-10-CM

## 2019-01-21 LAB — CBC WITH DIFFERENTIAL/PLATELET
Abs Immature Granulocytes: 0.04 10*3/uL (ref 0.00–0.07)
Basophils Absolute: 0 10*3/uL (ref 0.0–0.1)
Basophils Relative: 0 %
Eosinophils Absolute: 0 10*3/uL (ref 0.0–0.5)
Eosinophils Relative: 0 %
HCT: 36.3 % (ref 36.0–46.0)
Hemoglobin: 11.4 g/dL — ABNORMAL LOW (ref 12.0–15.0)
Immature Granulocytes: 1 %
Lymphocytes Relative: 10 %
Lymphs Abs: 0.8 10*3/uL (ref 0.7–4.0)
MCH: 30.5 pg (ref 26.0–34.0)
MCHC: 31.4 g/dL (ref 30.0–36.0)
MCV: 97.1 fL (ref 80.0–100.0)
Monocytes Absolute: 0.4 10*3/uL (ref 0.1–1.0)
Monocytes Relative: 5 %
Neutro Abs: 6.4 10*3/uL (ref 1.7–7.7)
Neutrophils Relative %: 84 %
Platelets: 271 10*3/uL (ref 150–400)
RBC: 3.74 MIL/uL — ABNORMAL LOW (ref 3.87–5.11)
RDW: 13.2 % (ref 11.5–15.5)
WBC: 7.7 10*3/uL (ref 4.0–10.5)
nRBC: 0 % (ref 0.0–0.2)

## 2019-01-21 MED ORDER — FENTANYL CITRATE (PF) 100 MCG/2ML IJ SOLN
50.0000 ug | Freq: Once | INTRAMUSCULAR | Status: AC
  Administered 2019-01-21: 50 ug via INTRAVENOUS
  Filled 2019-01-21: qty 2

## 2019-01-21 MED ORDER — ONDANSETRON HCL 4 MG/2ML IJ SOLN
4.0000 mg | Freq: Once | INTRAMUSCULAR | Status: AC
  Administered 2019-01-21: 4 mg via INTRAVENOUS
  Filled 2019-01-21: qty 2

## 2019-01-21 NOTE — ED Notes (Signed)
Patient transported to X-ray 

## 2019-01-21 NOTE — ED Notes (Signed)
Bed: WA25 Expected date:  Expected time:  Means of arrival:  Comments: 

## 2019-01-21 NOTE — ED Triage Notes (Signed)
[facility] transported pt from home to [facility] ED and reports the following:  Pt had a mechanical fall on left hip. Fell down 3-4 steps. Upon EMS arrived denies head, neck, back pain. Left hip deformity. Left leg inward rotation and elongation. Femur, tiba-fib intact.

## 2019-01-21 NOTE — ED Notes (Signed)
Pt oxygen dropping to 84-85% when she goes to sleep. Applied 2 L O2.
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
## 2019-01-22 ENCOUNTER — Inpatient Hospital Stay (HOSPITAL_COMMUNITY): Payer: 59

## 2019-01-22 ENCOUNTER — Inpatient Hospital Stay (HOSPITAL_COMMUNITY): Payer: 59 | Admitting: Anesthesiology

## 2019-01-22 ENCOUNTER — Encounter (HOSPITAL_COMMUNITY): Payer: Self-pay | Admitting: Internal Medicine

## 2019-01-22 ENCOUNTER — Emergency Department (HOSPITAL_COMMUNITY): Payer: 59

## 2019-01-22 ENCOUNTER — Encounter (HOSPITAL_COMMUNITY)
Admission: EM | Disposition: A | Discharge status: Home Health | Payer: Self-pay | Source: Home / Self Care | Attending: Family Medicine

## 2019-01-22 DIAGNOSIS — Z818 Family history of other mental and behavioral disorders: Secondary | ICD-10-CM | POA: Diagnosis not present

## 2019-01-22 DIAGNOSIS — K760 Fatty (change of) liver, not elsewhere classified: Secondary | ICD-10-CM | POA: Diagnosis present

## 2019-01-22 DIAGNOSIS — Z886 Allergy status to analgesic agent status: Secondary | ICD-10-CM | POA: Diagnosis not present

## 2019-01-22 DIAGNOSIS — S72002A Fracture of unspecified part of neck of left femur, initial encounter for closed fracture: Secondary | ICD-10-CM

## 2019-01-22 DIAGNOSIS — D649 Anemia, unspecified: Secondary | ICD-10-CM | POA: Diagnosis present

## 2019-01-22 DIAGNOSIS — Z8349 Family history of other endocrine, nutritional and metabolic diseases: Secondary | ICD-10-CM | POA: Diagnosis not present

## 2019-01-22 DIAGNOSIS — Z8249 Family history of ischemic heart disease and other diseases of the circulatory system: Secondary | ICD-10-CM | POA: Diagnosis not present

## 2019-01-22 DIAGNOSIS — F4310 Post-traumatic stress disorder, unspecified: Secondary | ICD-10-CM | POA: Diagnosis present

## 2019-01-22 DIAGNOSIS — Z811 Family history of alcohol abuse and dependence: Secondary | ICD-10-CM | POA: Diagnosis not present

## 2019-01-22 DIAGNOSIS — Z888 Allergy status to other drugs, medicaments and biological substances status: Secondary | ICD-10-CM | POA: Diagnosis not present

## 2019-01-22 DIAGNOSIS — Z1159 Encounter for screening for other viral diseases: Secondary | ICD-10-CM | POA: Diagnosis not present

## 2019-01-22 DIAGNOSIS — W109XXA Fall (on) (from) unspecified stairs and steps, initial encounter: Secondary | ICD-10-CM | POA: Diagnosis present

## 2019-01-22 DIAGNOSIS — Z9884 Bariatric surgery status: Secondary | ICD-10-CM | POA: Diagnosis not present

## 2019-01-22 DIAGNOSIS — Z885 Allergy status to narcotic agent status: Secondary | ICD-10-CM | POA: Diagnosis not present

## 2019-01-22 DIAGNOSIS — F329 Major depressive disorder, single episode, unspecified: Secondary | ICD-10-CM | POA: Diagnosis present

## 2019-01-22 DIAGNOSIS — Z8261 Family history of arthritis: Secondary | ICD-10-CM | POA: Diagnosis not present

## 2019-01-22 DIAGNOSIS — F32A Depression, unspecified: Secondary | ICD-10-CM | POA: Diagnosis present

## 2019-01-22 DIAGNOSIS — Z87891 Personal history of nicotine dependence: Secondary | ICD-10-CM | POA: Diagnosis not present

## 2019-01-22 DIAGNOSIS — I10 Essential (primary) hypertension: Secondary | ICD-10-CM | POA: Diagnosis present

## 2019-01-22 DIAGNOSIS — E785 Hyperlipidemia, unspecified: Secondary | ICD-10-CM | POA: Diagnosis present

## 2019-01-22 DIAGNOSIS — Z823 Family history of stroke: Secondary | ICD-10-CM | POA: Diagnosis not present

## 2019-01-22 DIAGNOSIS — G43909 Migraine, unspecified, not intractable, without status migrainosus: Secondary | ICD-10-CM | POA: Diagnosis present

## 2019-01-22 DIAGNOSIS — Z803 Family history of malignant neoplasm of breast: Secondary | ICD-10-CM | POA: Diagnosis not present

## 2019-01-22 DIAGNOSIS — S72009A Fracture of unspecified part of neck of unspecified femur, initial encounter for closed fracture: Secondary | ICD-10-CM | POA: Diagnosis present

## 2019-01-22 DIAGNOSIS — S72145A Nondisplaced intertrochanteric fracture of left femur, initial encounter for closed fracture: Secondary | ICD-10-CM | POA: Diagnosis present

## 2019-01-22 DIAGNOSIS — F319 Bipolar disorder, unspecified: Secondary | ICD-10-CM | POA: Diagnosis present

## 2019-01-22 HISTORY — PX: INTRAMEDULLARY (IM) NAIL INTERTROCHANTERIC: SHX5875

## 2019-01-22 LAB — BASIC METABOLIC PANEL
Anion gap: 8 (ref 5–15)
Anion gap: 9 (ref 5–15)
BUN: 10 mg/dL (ref 8–23)
BUN: 8 mg/dL (ref 8–23)
CO2: 21 mmol/L — ABNORMAL LOW (ref 22–32)
CO2: 22 mmol/L (ref 22–32)
Calcium: 7.9 mg/dL — ABNORMAL LOW (ref 8.9–10.3)
Calcium: 8.1 mg/dL — ABNORMAL LOW (ref 8.9–10.3)
Chloride: 101 mmol/L (ref 98–111)
Chloride: 104 mmol/L (ref 98–111)
Creatinine, Ser: 0.71 mg/dL (ref 0.44–1.00)
Creatinine, Ser: 0.74 mg/dL (ref 0.44–1.00)
GFR calc Af Amer: >60 mL/min (ref >60)
GFR calc Af Amer: >60 mL/min (ref >60)
GFR calc non Af Amer: >60 mL/min (ref >60)
GFR calc non Af Amer: >60 mL/min (ref >60)
Glucose, Bld: 111 mg/dL — ABNORMAL HIGH (ref 70–99)
Glucose, Bld: 115 mg/dL — ABNORMAL HIGH (ref 70–99)
Potassium: 3.7 mmol/L (ref 3.5–5.1)
Potassium: 4.2 mmol/L (ref 3.5–5.1)
Sodium: 131 mmol/L — ABNORMAL LOW (ref 135–145)
Sodium: 134 mmol/L — ABNORMAL LOW (ref 135–145)

## 2019-01-22 LAB — CBC
HCT: 35.3 % — ABNORMAL LOW (ref 36.0–46.0)
Hemoglobin: 11.2 g/dL — ABNORMAL LOW (ref 12.0–15.0)
MCH: 31.1 pg (ref 26.0–34.0)
MCHC: 31.7 g/dL (ref 30.0–36.0)
MCV: 98.1 fL (ref 80.0–100.0)
Platelets: 255 10*3/uL (ref 150–400)
RBC: 3.6 MIL/uL — ABNORMAL LOW (ref 3.87–5.11)
RDW: 13 % (ref 11.5–15.5)
WBC: 10.4 10*3/uL (ref 4.0–10.5)
nRBC: 0 % (ref 0.0–0.2)

## 2019-01-22 LAB — SARS CORONAVIRUS 2 BY RT PCR (HOSPITAL ORDER, PERFORMED IN ~~LOC~~ HOSPITAL LAB): SARS Coronavirus 2: NEGATIVE

## 2019-01-22 SURGERY — FIXATION, FRACTURE, INTERTROCHANTERIC, WITH INTRAMEDULLARY ROD
Anesthesia: Monitor Anesthesia Care | Site: Hip | Laterality: Left

## 2019-01-22 MED ORDER — HYDROCODONE-ACETAMINOPHEN 7.5-325 MG PO TABS
1.0000 | ORAL_TABLET | ORAL | Status: DC | PRN
  Administered 2019-01-22: 15:00:00 1 via ORAL
  Administered 2019-01-22 – 2019-01-23 (×4): 2 via ORAL
  Filled 2019-01-22 (×4): qty 2
  Filled 2019-01-22: qty 1

## 2019-01-22 MED ORDER — ALBUMIN HUMAN 5 % IV SOLN
INTRAVENOUS | Status: DC | PRN
  Administered 2019-01-22: 09:00:00 via INTRAVENOUS

## 2019-01-22 MED ORDER — METOCLOPRAMIDE HCL 5 MG/ML IJ SOLN: 5.0000 mg | Freq: Three times a day (TID) | INTRAMUSCULAR | Status: DC | PRN

## 2019-01-22 MED ORDER — ARIPIPRAZOLE 2 MG PO TABS
2.0000 mg | ORAL_TABLET | Freq: Every day | ORAL | Status: DC
  Administered 2019-01-22 – 2019-01-24 (×4): 2 mg via ORAL
  Filled 2019-01-22 (×4): qty 1

## 2019-01-22 MED ORDER — CEFAZOLIN SODIUM-DEXTROSE 2-3 GM-%(50ML) IV SOLR
INTRAVENOUS | Status: DC | PRN
  Administered 2019-01-22: 2 g via INTRAVENOUS

## 2019-01-22 MED ORDER — PHENYLEPHRINE 40 MCG/ML (10ML) SYRINGE FOR IV PUSH (FOR BLOOD PRESSURE SUPPORT)
PREFILLED_SYRINGE | INTRAVENOUS | Status: AC
  Filled 2019-01-22: qty 10

## 2019-01-22 MED ORDER — PROPOFOL 10 MG/ML IV BOLUS
INTRAVENOUS | Status: AC
  Filled 2019-01-22: qty 60

## 2019-01-22 MED ORDER — FENTANYL CITRATE (PF) 100 MCG/2ML IJ SOLN: 25.0000 ug | INTRAMUSCULAR | Status: DC | PRN

## 2019-01-22 MED ORDER — METOCLOPRAMIDE HCL 5 MG PO TABS: 5.0000 mg | ORAL_TABLET | Freq: Three times a day (TID) | ORAL | Status: DC | PRN

## 2019-01-22 MED ORDER — PANTOPRAZOLE SODIUM 40 MG IV SOLR
40.0000 mg | Freq: Every day | INTRAVENOUS | Status: DC
  Administered 2019-01-22 (×2): 40 mg via INTRAVENOUS
  Filled 2019-01-22 (×2): qty 40

## 2019-01-22 MED ORDER — TRANEXAMIC ACID-NACL 1000-0.7 MG/100ML-% IV SOLN
INTRAVENOUS | Status: AC
  Administered 2019-01-22: 13:00:00 1000 mg
  Filled 2019-01-22: qty 100

## 2019-01-22 MED ORDER — TRANEXAMIC ACID 1000 MG/10ML IV SOLN
INTRAVENOUS | Status: DC | PRN
  Administered 2019-01-22: 1000 mg via INTRAVENOUS

## 2019-01-22 MED ORDER — FENTANYL CITRATE (PF) 100 MCG/2ML IJ SOLN
25.0000 ug | INTRAMUSCULAR | Status: DC | PRN
  Administered 2019-01-22: 25 ug via INTRAVENOUS
  Filled 2019-01-22: qty 2

## 2019-01-22 MED ORDER — ISOPROPYL ALCOHOL 70 % SOLN
Status: DC | PRN
  Administered 2019-01-22: 1 via TOPICAL

## 2019-01-22 MED ORDER — PROPOFOL 500 MG/50ML IV EMUL
INTRAVENOUS | Status: DC | PRN
  Administered 2019-01-22: 75 ug/kg/min via INTRAVENOUS

## 2019-01-22 MED ORDER — TOPIRAMATE 100 MG PO TABS
200.0000 mg | ORAL_TABLET | Freq: Every day | ORAL | Status: DC
  Administered 2019-01-22 – 2019-01-24 (×4): 200 mg via ORAL
  Filled 2019-01-22 (×4): qty 2

## 2019-01-22 MED ORDER — DEXAMETHASONE SODIUM PHOSPHATE 10 MG/ML IJ SOLN
INTRAMUSCULAR | Status: AC
  Filled 2019-01-22: qty 1

## 2019-01-22 MED ORDER — LORAZEPAM 1 MG PO TABS
2.5000 mg | ORAL_TABLET | Freq: Every day | ORAL | Status: DC
  Administered 2019-01-22 – 2019-01-24 (×4): 2.5 mg via ORAL
  Filled 2019-01-22: qty 1
  Filled 2019-01-22 (×3): qty 2

## 2019-01-22 MED ORDER — HYDROCODONE-ACETAMINOPHEN 5-325 MG PO TABS: 1.0000 | ORAL_TABLET | ORAL | Status: DC | PRN

## 2019-01-22 MED ORDER — CEFAZOLIN SODIUM-DEXTROSE 2-4 GM/100ML-% IV SOLN
INTRAVENOUS | Status: AC
  Filled 2019-01-22: qty 100

## 2019-01-22 MED ORDER — PROPOFOL 10 MG/ML IV BOLUS
INTRAVENOUS | Status: AC
  Filled 2019-01-22: qty 20

## 2019-01-22 MED ORDER — HYDROMORPHONE HCL 1 MG/ML IJ SOLN
0.5000 mg | INTRAMUSCULAR | Status: DC | PRN
  Administered 2019-01-22 – 2019-01-23 (×3): 0.5 mg via INTRAVENOUS
  Filled 2019-01-22 (×3): qty 0.5

## 2019-01-22 MED ORDER — PHENYLEPHRINE 40 MCG/ML (10ML) SYRINGE FOR IV PUSH (FOR BLOOD PRESSURE SUPPORT)
PREFILLED_SYRINGE | INTRAVENOUS | Status: DC | PRN
  Administered 2019-01-22 (×9): 40 ug via INTRAVENOUS
  Administered 2019-01-22: 80 ug via INTRAVENOUS
  Administered 2019-01-22 (×3): 40 ug via INTRAVENOUS

## 2019-01-22 MED ORDER — LACTATED RINGERS IV SOLN
INTRAVENOUS | Status: DC | PRN
  Administered 2019-01-22: 08:00:00 via INTRAVENOUS

## 2019-01-22 MED ORDER — MENTHOL 3 MG MT LOZG: 1.0000 | LOZENGE | OROMUCOSAL | Status: DC | PRN

## 2019-01-22 MED ORDER — ENSURE PRE-SURGERY PO LIQD
296.0000 mL | Freq: Once | ORAL | Status: DC
  Filled 2019-01-22: qty 296

## 2019-01-22 MED ORDER — POVIDONE-IODINE 10 % EX SWAB: 2.0000 "application " | Freq: Once | CUTANEOUS | Status: DC

## 2019-01-22 MED ORDER — FLUOXETINE HCL 20 MG PO CAPS
60.0000 mg | ORAL_CAPSULE | Freq: Every day | ORAL | Status: DC
  Administered 2019-01-22 – 2019-01-24 (×4): 60 mg via ORAL
  Filled 2019-01-22 (×4): qty 3

## 2019-01-22 MED ORDER — ONDANSETRON HCL 4 MG/2ML IJ SOLN
INTRAMUSCULAR | Status: DC | PRN
  Administered 2019-01-22: 4 mg via INTRAVENOUS

## 2019-01-22 MED ORDER — FENTANYL CITRATE (PF) 100 MCG/2ML IJ SOLN
50.0000 ug | Freq: Once | INTRAMUSCULAR | Status: DC
  Filled 2019-01-22: qty 2

## 2019-01-22 MED ORDER — FENTANYL CITRATE (PF) 100 MCG/2ML IJ SOLN
INTRAMUSCULAR | Status: DC | PRN
  Administered 2019-01-22: 100 ug via INTRAVENOUS

## 2019-01-22 MED ORDER — DEXAMETHASONE SODIUM PHOSPHATE 10 MG/ML IJ SOLN
INTRAMUSCULAR | Status: DC | PRN
  Administered 2019-01-22: 8 mg via INTRAVENOUS

## 2019-01-22 MED ORDER — ONDANSETRON HCL 4 MG PO TABS: 4.0000 mg | ORAL_TABLET | Freq: Four times a day (QID) | ORAL | Status: DC | PRN

## 2019-01-22 MED ORDER — PROMETHAZINE HCL 25 MG/ML IJ SOLN: 6.2500 mg | INTRAMUSCULAR | Status: DC | PRN

## 2019-01-22 MED ORDER — ACETAMINOPHEN 325 MG PO TABS: 325.0000 mg | ORAL_TABLET | Freq: Four times a day (QID) | ORAL | Status: DC | PRN

## 2019-01-22 MED ORDER — PROPOFOL 10 MG/ML IV BOLUS
INTRAVENOUS | Status: DC | PRN
  Administered 2019-01-22: 30 mg via INTRAVENOUS

## 2019-01-22 MED ORDER — BUPIVACAINE IN DEXTROSE 0.75-8.25 % IT SOLN
INTRATHECAL | Status: DC | PRN
  Administered 2019-01-22: 1.8 mL via INTRATHECAL

## 2019-01-22 MED ORDER — FENTANYL CITRATE (PF) 100 MCG/2ML IJ SOLN
25.0000 ug | Freq: Once | INTRAMUSCULAR | Status: AC
  Administered 2019-01-22: 02:00:00 25 ug via INTRAVENOUS

## 2019-01-22 MED ORDER — ENSURE SURGERY PO LIQD
237.0000 mL | Freq: Two times a day (BID) | ORAL | Status: DC
  Filled 2019-01-22 (×7): qty 237

## 2019-01-22 MED ORDER — TRANEXAMIC ACID-NACL 1000-0.7 MG/100ML-% IV SOLN
1000.0000 mg | INTRAVENOUS | Status: AC
  Administered 2019-01-22: 13:00:00 1000 mg via INTRAVENOUS
  Filled 2019-01-22: qty 100

## 2019-01-22 MED ORDER — DOCUSATE SODIUM 100 MG PO CAPS
100.0000 mg | ORAL_CAPSULE | Freq: Two times a day (BID) | ORAL | Status: DC
  Administered 2019-01-22 – 2019-01-25 (×6): 100 mg via ORAL
  Filled 2019-01-22 (×6): qty 1

## 2019-01-22 MED ORDER — ONDANSETRON HCL 4 MG/2ML IJ SOLN
4.0000 mg | Freq: Four times a day (QID) | INTRAMUSCULAR | Status: DC | PRN
  Administered 2019-01-23 – 2019-01-25 (×2): 4 mg via INTRAVENOUS
  Filled 2019-01-22 (×2): qty 2

## 2019-01-22 MED ORDER — MIDAZOLAM HCL 2 MG/2ML IJ SOLN
INTRAMUSCULAR | Status: DC | PRN
  Administered 2019-01-22: 2 mg via INTRAVENOUS

## 2019-01-22 MED ORDER — ENOXAPARIN SODIUM 40 MG/0.4ML ~~LOC~~ SOLN
40.0000 mg | SUBCUTANEOUS | Status: DC
  Administered 2019-01-23 – 2019-01-25 (×3): 40 mg via SUBCUTANEOUS
  Filled 2019-01-22 (×3): qty 0.4

## 2019-01-22 MED ORDER — CHLORHEXIDINE GLUCONATE 4 % EX LIQD: 60.0000 mL | Freq: Once | CUTANEOUS | Status: DC

## 2019-01-22 MED ORDER — SODIUM CHLORIDE 0.9 % IR SOLN
Status: DC | PRN
  Administered 2019-01-22: 1000 mL

## 2019-01-22 MED ORDER — MIDAZOLAM HCL 2 MG/2ML IJ SOLN
INTRAMUSCULAR | Status: AC
  Filled 2019-01-22: qty 2

## 2019-01-22 MED ORDER — PHENOL 1.4 % MT LIQD
1.0000 | OROMUCOSAL | Status: DC | PRN
  Filled 2019-01-22: qty 177

## 2019-01-22 MED ORDER — FENTANYL CITRATE (PF) 100 MCG/2ML IJ SOLN
INTRAMUSCULAR | Status: AC
  Filled 2019-01-22: qty 2

## 2019-01-22 MED ORDER — CEFAZOLIN SODIUM-DEXTROSE 2-4 GM/100ML-% IV SOLN: 2.0000 g | INTRAVENOUS | Status: DC

## 2019-01-22 MED ORDER — ONDANSETRON HCL 4 MG/2ML IJ SOLN
INTRAMUSCULAR | Status: AC
  Filled 2019-01-22: qty 2

## 2019-01-22 MED ORDER — SENNA 8.6 MG PO TABS
1.0000 | ORAL_TABLET | Freq: Two times a day (BID) | ORAL | Status: DC
  Administered 2019-01-22 – 2019-01-25 (×6): 8.6 mg via ORAL
  Filled 2019-01-22 (×6): qty 1

## 2019-01-22 MED ORDER — CEFAZOLIN SODIUM-DEXTROSE 2-4 GM/100ML-% IV SOLN
2.0000 g | Freq: Four times a day (QID) | INTRAVENOUS | Status: AC
  Administered 2019-01-22 (×2): 2 g via INTRAVENOUS
  Filled 2019-01-22 (×2): qty 100

## 2019-01-22 SURGICAL SUPPLY — 41 items
BAG ZIPLOCK 12X15 (MISCELLANEOUS) IMPLANT
BIT DRILL CANN LG 4.3MM (BIT) ×1 IMPLANT
CHLORAPREP W/TINT 26 (MISCELLANEOUS) ×3 IMPLANT
COVER PERINEAL POST (MISCELLANEOUS) ×3 IMPLANT
COVER SURGICAL LIGHT HANDLE (MISCELLANEOUS) ×3 IMPLANT
COVER WAND RF STERILE (DRAPES) IMPLANT
DERMABOND ADVANCED (GAUZE/BANDAGES/DRESSINGS) ×4
DERMABOND ADVANCED .7 DNX12 (GAUZE/BANDAGES/DRESSINGS) ×2 IMPLANT
DRAPE C-ARM 42X120 X-RAY (DRAPES) ×3 IMPLANT
DRAPE C-ARMOR (DRAPES) ×3 IMPLANT
DRAPE IMP U-DRAPE 54X76 (DRAPES) ×6 IMPLANT
DRAPE SHEET LG 3/4 BI-LAMINATE (DRAPES) ×6 IMPLANT
DRAPE STERI IOBAN 125X83 (DRAPES) ×3 IMPLANT
DRAPE U-SHAPE 47X51 STRL (DRAPES) ×6 IMPLANT
DRESSING AQUACEL AG SP 3.5X4 (GAUZE/BANDAGES/DRESSINGS) ×1 IMPLANT
DRESSING AQUACEL AG SP 3.5X6 (GAUZE/BANDAGES/DRESSINGS) ×1 IMPLANT
DRILL BIT CANN LG 4.3MM (BIT) ×3
DRSG AQUACEL AG SP 3.5X4 (GAUZE/BANDAGES/DRESSINGS) ×3
DRSG AQUACEL AG SP 3.5X6 (GAUZE/BANDAGES/DRESSINGS) ×3
DRSG MEPILEX BORDER 4X4 (GAUZE/BANDAGES/DRESSINGS) ×3 IMPLANT
DRSG MEPILEX BORDER 4X8 (GAUZE/BANDAGES/DRESSINGS) ×3 IMPLANT
ELECT BLADE TIP CTD 4 INCH (ELECTRODE) IMPLANT
FACESHIELD WRAPAROUND (MASK) ×6 IMPLANT
GAUZE SPONGE 4X4 12PLY STRL (GAUZE/BANDAGES/DRESSINGS) ×3 IMPLANT
GLOVE BIO SURGEON STRL SZ8.5 (GLOVE) ×6 IMPLANT
GLOVE BIOGEL PI IND STRL 8.5 (GLOVE) ×1 IMPLANT
GLOVE BIOGEL PI INDICATOR 8.5 (GLOVE) ×2
GOWN SPEC L3 XXLG W/TWL (GOWN DISPOSABLE) ×3 IMPLANT
GUIDEPIN 3.2X17.5 THRD DISP (PIN) ×3 IMPLANT
HIP FRA NAIL LAG SCREW 10.5X90 (Orthopedic Implant) ×3 IMPLANT
KIT BASIN OR (CUSTOM PROCEDURE TRAY) ×3 IMPLANT
KIT TURNOVER KIT A (KITS) IMPLANT
MANIFOLD NEPTUNE II (INSTRUMENTS) ×3 IMPLANT
MARKER SKIN DUAL TIP RULER LAB (MISCELLANEOUS) ×3 IMPLANT
NAIL HIP FRACT 130D 9X180 (Orthopedic Implant) ×3 IMPLANT
PACK GENERAL/GYN (CUSTOM PROCEDURE TRAY) ×3 IMPLANT
SCREW BONE CORTICAL 5.0X3 (Screw) ×3 IMPLANT
SCREW LAG HIP FRA NAIL 10.5X90 (Orthopedic Implant) ×1 IMPLANT
SUT MNCRL AB 3-0 PS2 18 (SUTURE) ×3 IMPLANT
SUT VIC AB 1 CT1 36 (SUTURE) ×3 IMPLANT
TOWEL OR 17X26 10 PK STRL BLUE (TOWEL DISPOSABLE) ×3 IMPLANT

## 2019-01-22 NOTE — ED Notes (Signed)
Pt's husband called and requested an update on the pt. Gave him an update.

## 2019-01-22 NOTE — ED Notes (Signed)
HCG bath given @0620am  by Dennie Bible, and Cassandra RN

## 2019-01-22 NOTE — ED Notes (Signed)
Pt transferred to  OR.

## 2019-01-22 NOTE — Op Note (Signed)
OPERATIVE REPORT  SURGEON: Rod Can, MD   ASSISTANT: Staff.  PREOPERATIVE DIAGNOSIS: Left intertrochanteric femur fracture.   POSTOPERATIVE DIAGNOSIS: Left intertrochanteric femur fracture.   PROCEDURE: Intramedullary fixation, Left femur.   IMPLANTS: Biomet Affixus Hip Fracture Nail, 9 by 180 mm, 130 degrees. 10.5 x 34 mm Hip Fracture Nail Lag Screw. 5 x 34 mm distal interlocking screw 1.  ANESTHESIA:  Spinal  ESTIMATED BLOOD LOSS:-100 mL    ANTIBIOTICS: 2 g Ancef.  DRAINS: None.  COMPLICATIONS: None.   CONDITION: PACU - hemodynamically stable.  BRIEF CLINICAL NOTE: Misty Howard is a 63 y.o. female who presented with an intertrochanteric femur fracture. The patient was admitted to the hospitalist service and underwent perioperative risk stratification and medical optimization. The risks, benefits, and alternatives to the procedure were explained, and the patient elected to proceed.  PROCEDURE IN DETAIL: Surgical site was marked by myself. The patient was taken to the operating room and anesthesia was induced on the bed. The patient was then transferred to the [facility] table and the nonoperative lower extremity was scissored underneath the operative side. The fracture was reduced with traction, internal rotation, and adduction. The hip was prepped and draped in the normal sterile surgical fashion. Timeout was called verifying side and site of surgery. Preop antibiotics were given with 60 minutes of beginning the procedure.  Fluoroscopy was used to define the patient's anatomy. A 4 cm incision was made just proximal to the tip of the greater trochanter. The awl was used to obtain the standard starting point for a trochanteric entry nail under fluoroscopic control. The guidepin was placed. The entry reamer was used to open the proximal femur.  On the back table, the nail was assembled onto the jig. The nail was placed into the femur without any difficulty. Through a separate stab  incision, the cannula was placed down to the bone in preparation for the cephalomedullary device. A guidepin was placed into the femoral head using AP and lateral fluoroscopy views. The pin was measured, and then reaming was performed to the appropriate depth. The lag screw was inserted to the appropriate depth. The fracture was compressed through the jig. The setscrew was tightened and then loosened one quarter turn. A separate stab incision was created, and the distal interlocking screw was placed using standard AO technique. The jig was removed. Final AP and lateral fluoroscopy views were obtained to confirm fracture reduction and hardware placement. Tip apex distance was appropriate. There was no chondral penetration.  The wounds were copiously irrigated with saline. The wound was closed in layers with #1 Vicryl for the fascia, 2-0 Monocryl for the deep dermal layer, and 3-0 Monocryl subcuticular stitch. Glue was applied to the skin. Once the glue was fully hardened, sterile dressing was applied. The patient was then awakened from anesthesia and taken to the PACU in stable condition. Sponge needle and instrument counts were correct at the end of the case 2. There were no known complications.  We will readmit the patient to the hospitalist. Weightbearing status will be weightbearing as tolerated with a walker. We will begin Lovenox in house and discharge on ASA 81 mg PO BID x 6 weeks for DVT prophylaxis. The patient will work with physical therapy and undergo disposition planning.

## 2019-01-22 NOTE — Anesthesia Postprocedure Evaluation (Signed)
Anesthesia Post Note  Patient: Misty Howard  Procedure(s) Performed: INTRAMEDULLARY (IM) NAIL INTERTROCHANTRIC (Left Hip)     Patient location during evaluation: PACU Anesthesia Type: MAC and Spinal Level of consciousness: awake and alert Pain management: pain level controlled Vital Signs Assessment: post-procedure vital signs reviewed and stable Respiratory status: spontaneous breathing and respiratory function stable Cardiovascular status: blood pressure returned to baseline and stable Postop Assessment: spinal receding Anesthetic complications: no    Last Vitals:  Vitals:   01/22/19 1030 01/22/19 1045  BP: 123/74 129/73  Pulse: 77 76  Resp: 10 13  SpO2: 100% 100%    Last Pain:  Vitals:   01/22/19 1045  PainSc: 0-No pain                 Allsion Nogales DANIEL

## 2019-01-22 NOTE — Consult Note (Signed)
ORTHOPAEDIC CONSULTATION  REQUESTING PHYSICIAN: Elodia Florence., *  PCP:  Patient, No Pcp Per  Chief Complaint: Left hip injury  HPI: Misty Howard is a 63 y.o. female with a history of PTSD, anxiety, gastric bypass, and migraines who complains of left hip pain after she fell while going downstairs last night at home.  She had left hip pain and inability to weight-bear.  She was brought to the emergency department at [facility], where x-rays revealed an unstable left intertrochanteric femur fracture.  She was admitted to the hospitalist service for perioperative or stratification and medical optimization.  Orthopedic consultation was placed for management of her left hip fracture.  She lives at home.  Sometimes she uses a cane.  Past Medical History:  Diagnosis Date   Anxiety    Bipolar disorder ([place])    Cancer ([place])    Depression    Esophageal ring    s/p dilation 2003   Headache(784.0)    frequent   Hepatic steatosis    Hyperlipidemia    LAE (left atrial enlargement)    per records on echo in 2008   Migraine    Protein calorie malnutrition ([place])    Past Surgical History:  Procedure Laterality Date   ABDOMINAL HYSTERECTOMY  1991   APPENDECTOMY  1972   breast [place]   gastric bypass surgery  03/2009   right arm upper and lower     TONSILLECTOMY AND ADENOIDECTOMY  2004   Social History   Socioeconomic History   Marital status: Married    Spouse name: Not on file   Number of children: Not on file   Years of education: Not on file   Highest education level: Not on file  Occupational History   Not on file  Social Needs   Financial resource strain: Not on file   Food insecurity    Worry: Not on file    Inability: Not on file   Transportation needs    Medical: Not on file    Non-medical: Not on file  Tobacco Use   Smoking status: Former Smoker   Smokeless tobacco: Never Used    Substance and Sexual Activity   Alcohol use: Yes   Drug use: Not on file   Sexual activity: Not on file  Lifestyle   Physical activity    Days per week: Not on file    Minutes per session: Not on file   Stress: Not on file  Relationships   Social connections    Talks on phone: Not on file    Gets together: Not on file    Attends religious service: Not on file    Active member of club or organization: Not on file    Attends meetings of clubs or organizations: Not on file    Relationship status: Not on file  Other Topics Concern   Not on file  Social History Narrative   Not on file   Family History  Problem Relation Age of Onset   Arthritis Mother    Cancer Mother        breast   Hyperlipidemia Mother    Depression Mother    Alcohol abuse Father    Arthritis Father    Heart disease Father    Stroke Father    Hypertension Father    Cancer Maternal Grandmother    Arthritis Maternal Grandfather    Hyperlipidemia Maternal Grandfather    Alcohol  abuse Paternal Grandfather    Depression Sister    Allergies  Allergen Reactions   Codeine Phosphate Nausea And Vomiting   Lipitor [Atorvastatin] Other (See Comments)    Muscle pain   Morphine And Related Other (See Comments)    Causes pt to sweat and makes her crazy   Nsaids Other (See Comments)    Gastric bypass surgery history   Statins Other (See Comments)    Muscle pain   Zocor [Simvastatin] Other (See Comments)    Muscle pain   Prior to Admission medications   Medication Sig Start Date End Date Taking? Authorizing Provider  ARIPiprazole (ABILIFY) 2 MG tablet Take 1 tablet (2 mg total) by mouth daily. Patient taking differently: Take 2 mg by mouth at bedtime.  07/31/18 01/22/19 Yes Thayer Headings, PMHNP  Cyanocobalamin (VITAMIN B-12) 5000 MCG SUBL Place 1 tablet under the tongue at bedtime.   Yes [provider]  FLUoxetine (PROZAC) 20 MG capsule Take 3 capsules (60 mg total) by  mouth at bedtime. 07/31/18  Yes Thayer Headings, PMHNP  LORazepam (ATIVAN) 1 MG tablet Take 2.5 tablets (2.5 mg total) by mouth at bedtime for 30 days. 10/05/18 01/22/19 Yes Thayer Headings, PMHNP  Multiple Vitamins-Minerals (CENTRUM SILVER 50+WOMEN) TABS Take 1 tablet by mouth at bedtime.   Yes [provider]  pantoprazole (PROTONIX) 40 MG tablet Take 40 mg by mouth at bedtime.    Yes [provider]  promethazine (PHENERGAN) 50 MG tablet Take 50 mg by mouth every 6 (six) hours as needed for nausea or vomiting.   Yes [provider]  topiramate (TOPAMAX) 100 MG tablet Take 2 tablets (200 mg total) by mouth at bedtime. 07/31/18 01/22/19 Yes Thayer Headings, PMHNP  cyclobenzaprine (FLEXERIL) 5 MG tablet Can use one tablet up to twice daily only if needed for back spasms. Patient not taking: Reported on 01/22/2019 05/05/12   Lucretia Kern, DO   Dg Chest 1 View  Result Date: 01/22/2019 CLINICAL DATA:  Preop, hip fracture EXAM: CHEST  1 VIEW COMPARISON:  None. FINDINGS: Heart and mediastinal contours are within normal limits. No focal opacities or effusions. No acute bony abnormality. IMPRESSION: No active disease. Electronically Signed   By: Rolm Baptise M.D.   On: 01/22/2019 00:44   Dg Knee Complete 4 Views Left  Result Date: 01/22/2019 CLINICAL DATA:  Fractured hip.  Preop EXAM: LEFT KNEE - COMPLETE 4+ VIEW COMPARISON:  None. FINDINGS: No acute bony abnormality. Specifically, no fracture, subluxation, or dislocation. Early joint space narrowing in the patellofemoral compartment. No joint effusion. IMPRESSION: No acute bony abnormality. Electronically Signed   By: Rolm Baptise M.D.   On: 01/22/2019 00:44   Dg Hips Bilat W Or Wo Pelvis 3-4 Views  Result Date: 01/21/2019 CLINICAL DATA:  Fall, lateral left hip pain EXAM: DG HIP (WITH OR WITHOUT PELVIS) 3-4V BILAT COMPARISON:  None. FINDINGS: There is a left femoral intertrochanteric fracture. No significant angulation. No subluxation or  dislocation. Hip joints and SI joints are symmetric and unremarkable. IMPRESSION: Left femoral intertrochanteric fracture. Electronically Signed   By: Rolm Baptise M.D.   On: 01/21/2019 23:19    Positive ROS: All other systems have been reviewed and were otherwise negative with the exception of those mentioned in the HPI and as above.  Physical Exam: General: Alert, no acute distress Cardiovascular: No pedal edema Respiratory: No cyanosis, no use of accessory musculature GI: No organomegaly, abdomen is soft and non-tender Skin: No lesions in the area of  chief complaint Neurologic: Sensation intact distally Psychiatric: Patient is competent for consent with normal mood and affect Lymphatic: No axillary or cervical lymphadenopathy  MUSCULOSKELETAL: Examination of the left hip reveals no skin wounds or lesions.  She has pain with logrolling.  Cannot perform a straight leg raise secondary to pain.  Neurovascularly intact distally.  Assessment: Left intertrochanteric femur fracture  Plan: I discussed the findings with the patient.  Her injury requires surgical stabilization.  We discussed the risk, benefits, and alternatives to intramedullary fixation of the left femur.  Plan for surgery today.  All questions solicited and answered.  The risks, benefits, and alternatives were discussed with the patient. There are risks associated with the surgery including, but not limited to, problems with anesthesia (death), infection, differences in leg length/angulation/rotation, fracture of bones, loosening or failure of implants, malunion, nonunion, hematoma (blood accumulation) which may require surgical drainage, blood clots, pulmonary embolism, nerve injury (foot drop), and blood vessel injury. The patient understands these risks and elects to proceed.    Bertram Savin, MD Cell [phone]    01/22/2019 7:57 AM

## 2019-01-22 NOTE — Transfer of Care (Signed)
Immediate Anesthesia Transfer of Care Note  Patient: Misty Howard  Procedure(s) Performed: INTRAMEDULLARY (IM) NAIL INTERTROCHANTRIC (Left Hip)  Patient Location: PACU  Anesthesia Type:Spinal  Level of Consciousness: awake, alert  and oriented  Airway & Oxygen Therapy: Patient Spontanous Breathing and Patient connected to face mask oxygen  Post-op Assessment: Report given to RN and Post -op Vital signs reviewed and stable  Post vital signs: Reviewed and stable  Last Vitals:  Vitals Value Taken Time  BP    Temp    Pulse    Resp    SpO2      Last Pain:  Vitals:   01/22/19 0200  PainSc: 10-Worst pain ever         Complications: No apparent anesthesia complications

## 2019-01-22 NOTE — H&P (Signed)
History and Physical    BRISELDA NAVAL LFY:[id number] DOB: 01-04-1956 DOA: 01/21/2019  PCP: Patient, No Pcp Per  Patient coming from: Home.  Chief Complaint: Fall.  HPI: Misty Howard is a 63 y.o. female with history of PTSD, anxiety, gastric bypass on B12 supplements, migraine on Topamax had a fall at home when patient was walking down the stairs.  Patient states she slipped and missed the last days and fell onto the floor.  Denies hitting her head or losing consciousness denies any chest pain or shortness of breath.  She had a left side and was brought to the ER.  ED Course: X-rays in the ER showed left intertrochanteric fracture of the hip.  On-call orthopedic surgeon Dr. Lyla Glassing was consulted.  Plan is to have surgery later today.  Chest x-ray was unremarkable EKG shows normal sinus rhythm.  Labs show hemoglobin of 11.4 sodium 134 otherwise unremarkable.  Patient admitted for hip fracture.  COVID-19 was negative.  Review of Systems: As per HPI, rest all negative.   Past Medical History:  Diagnosis Date  . Anxiety   . Bipolar disorder ([place])   . Cancer ([place])   . Depression   . Esophageal ring    s/p dilation 2003  . Headache(784.0)    frequent  . Hepatic steatosis   . Hyperlipidemia   . LAE (left atrial enlargement)    per records on echo in 2008  . Migraine   . Protein calorie malnutrition ([place])     Past Surgical History:  Procedure Laterality Date  . ABDOMINAL HYSTERECTOMY  1991  . APPENDECTOMY  1972  . breast reduciton  1988  . CHOLECYSTECTOMY  1979  . gastric bypass surgery  03/2009  . right arm upper and lower    . TONSILLECTOMY AND ADENOIDECTOMY  2004     reports that she has quit smoking. She has never used smokeless tobacco. She reports current alcohol use. No history on file for drug.  Allergies  Allergen Reactions  . Codeine Phosphate   . Lipitor [Atorvastatin]   . Morphine And Related   . Nsaids   . Statins   . Zocor [Simvastatin]     Family  History  Problem Relation Age of Onset  . Arthritis Mother   . Cancer Mother        breast  . Hyperlipidemia Mother   . Depression Mother   . Alcohol abuse Father   . Arthritis Father   . Heart disease Father   . Stroke Father   . Hypertension Father   . Cancer Maternal Grandmother   . Arthritis Maternal Grandfather   . Hyperlipidemia Maternal Grandfather   . Alcohol abuse Paternal Grandfather   . Depression Sister     Prior to Admission medications   Medication Sig Start Date End Date Taking? Authorizing Provider  ARIPiprazole (ABILIFY) 2 MG tablet Take 1 tablet (2 mg total) by mouth daily. 07/31/18 10/29/18  Thayer Headings, PMHNP  Cyanocobalamin (VITAMIN B-12) 5000 MCG TBDP Take 5,000 mcg by mouth daily.     [provider]  cyclobenzaprine (FLEXERIL) 5 MG tablet Can use one tablet up to twice daily only if needed for back spasms. Patient not taking: Reported on 07/31/2018 05/05/12   Colin Benton R, DO  ferrous fumarate (HEMOCYTE - 106 MG FE) 325 (106 FE) MG TABS Take 1 tablet by mouth.    [provider]  FLUoxetine (PROZAC) 20 MG capsule Take 3 capsules (60 mg total) by mouth at  bedtime. 07/31/18   Thayer Headings, PMHNP  LORazepam (ATIVAN) 1 MG tablet Take 2.5 tablets (2.5 mg total) by mouth at bedtime for 30 days. 10/05/18 11/04/18  Thayer Headings, PMHNP  Multiple Vitamin (MULTIVITAMIN) tablet Take 1 tablet by mouth daily.    [provider]  pantoprazole (PROTONIX) 40 MG tablet Take 40 mg by mouth 2 (two) times daily.    [provider]  promethazine (PHENERGAN) 25 MG tablet Take 25 mg by mouth every 6 (six) hours as needed.    [provider]  topiramate (TOPAMAX) 100 MG tablet Take 2 tablets (200 mg total) by mouth at bedtime. 07/31/18 10/29/18  Thayer Headings, PMHNP    Physical Exam: Constitutional: Moderately built and nourished. Vitals:   01/21/19 2243 01/21/19 2323 01/21/19 2325 01/21/19 2330  BP:  113/75  128/79  Pulse: (!) 111  91  99  Resp:    17  SpO2: 93% 94% (!) 2% 99%  Weight:      Height:       Eyes: Anicteric no pallor. ENMT: No discharge from the ears eyes nose or mouth. Neck: No mass felt.  No neck rigidity. Respiratory: No rhonchi or crepitations. Cardiovascular: S1-S2 heard. Abdomen: Soft nontender bowel sounds present. Musculoskeletal: Pain on moving left hip. Skin: No rash. Neurologic: Alert awake oriented to time place and person.  Moves all extremities. Psychiatric: Appears normal per normal affect.   Labs on Admission: I have personally reviewed following labs and imaging studies  CBC: Recent Labs  Lab 01/21/19 2331  WBC 7.7  NEUTROABS 6.4  HGB 11.4*  HCT 36.3  MCV 97.1  PLT 546   Basic Metabolic Panel: Recent Labs  Lab 01/21/19 2331  NA 134*  K 3.7  CL 104  CO2 22  GLUCOSE 115*  BUN 10  CREATININE 0.71  CALCIUM 7.9*   GFR: Estimated Creatinine Clearance: 66.5 mL/min (by C-G formula based on SCr of 0.71 mg/dL). Liver Function Tests: No results for input(s): AST, ALT, ALKPHOS, BILITOT, PROT, ALBUMIN in the last 168 hours. No results for input(s): LIPASE, AMYLASE in the last 168 hours. No results for input(s): AMMONIA in the last 168 hours. Coagulation Profile: No results for input(s): INR, PROTIME in the last 168 hours. Cardiac Enzymes: No results for input(s): CKTOTAL, CKMB, CKMBINDEX, TROPONINI in the last 168 hours. BNP (last 3 results) No results for input(s): PROBNP in the last 8760 hours. HbA1C: No results for input(s): HGBA1C in the last 72 hours. CBG: No results for input(s): GLUCAP in the last 168 hours. Lipid Profile: No results for input(s): CHOL, HDL, LDLCALC, TRIG, CHOLHDL, LDLDIRECT in the last 72 hours. Thyroid Function Tests: No results for input(s): TSH, T4TOTAL, FREET4, T3FREE, THYROIDAB in the last 72 hours. Anemia Panel: No results for input(s): VITAMINB12, FOLATE, FERRITIN, TIBC, IRON, RETICCTPCT in the last 72 hours. Urine analysis: No  results found for: COLORURINE, APPEARANCEUR, LABSPEC, PHURINE, GLUCOSEU, HGBUR, BILIRUBINUR, KETONESUR, PROTEINUR, UROBILINOGEN, NITRITE, LEUKOCYTESUR Sepsis Labs: @LABRCNTIP (procalcitonin:4,lacticidven:4) )No results found for this or any previous visit (from the past 240 hour(s)).   Radiological Exams on Admission: Dg Hips Bilat W Or Wo Pelvis 3-4 Views  Result Date: 01/21/2019 CLINICAL DATA:  Fall, lateral left hip pain EXAM: DG HIP (WITH OR WITHOUT PELVIS) 3-4V BILAT COMPARISON:  None. FINDINGS: There is a left femoral intertrochanteric fracture. No significant angulation. No subluxation or dislocation. Hip joints and SI joints are symmetric and unremarkable. IMPRESSION: Left femoral intertrochanteric fracture. Electronically Signed   By: Rolm Baptise M.D.  On: 01/21/2019 23:19    EKG: Independently reviewed.  Normal sinus rhythm.  Assessment/Plan Principal Problem:   Closed left hip fracture, initial encounter ([place]) Active Problems:   HLD (hyperlipidemia)   Depression    1. Closed left hip fracture status post mechanical fall -patient appears to be at moderate risk for intermediate risk procedure.  We will keep patient n.p.o. except medications.  Pain relief medications.  Orthopedic has been consulted. 2. History of PTSD on fluoxetine and Abilify.  Take lorazepam for sleep. 3. History of migraine on Topamax 200 mg at bedtime. 4. History of hyperlipidemia. 5. Normocytic normochromic anemia appears to be chronic.  Patient's takes B12 supplements after gastric bypass in 2010.   DVT prophylaxis: SCDs in anticipation of surgery. Code Status: Full code. Family Communication: Discussed with patient. Disposition Plan: Probably will need rehab. Consults called: Orthopedics. Admission status: Inpatient.   Rise Patience MD [facility] Pager [phone].  If 7PM-7AM, please contact night-coverage [email or web address] Password [facility]  01/22/2019, 12:33 AM

## 2019-01-22 NOTE — Progress Notes (Signed)
PROGRESS NOTE    Misty Howard  GYJ:[id number] DOB: [birth year] DOA: 01/21/2019 PCP: Patient, No Pcp Per   Brief Narrative:  Misty Howard is Misty Howard 63 y.o. female with history of PTSD, anxiety, gastric bypass on B12 supplements, migraine on Topamax had Misty Mcfarren fall at home when patient was walking down the stairs.  Patient states she slipped and missed the last days and fell onto the floor.  Denies hitting her head or losing consciousness denies any chest pain or shortness of breath.  She had Shanena Pellegrino left side and was brought to the ER.  ED Course: X-rays in the ER showed left intertrochanteric fracture of the hip.  On-call orthopedic surgeon Dr. Lyla Glassing was consulted.  Plan is to have surgery later today.  Chest x-ray was unremarkable EKG shows normal sinus rhythm.  Labs show hemoglobin of 11.4 sodium 134 otherwise unremarkable.  Patient admitted for hip fracture.  COVID-19 was negative.   Assessment & Plan:   Principal Problem:   Closed left hip fracture, initial encounter ([place]) Active Problems:   HLD (hyperlipidemia)   Depression   1. Closed left hip fracture status post mechanical fall  1. Now s/p IM fixation of L femur 2. WBAT 3. Lovenox in house and d/c on ASA 81 mg PO BID for DVT ppx 4. Pain control 5. PT     2. History of PTSD on fluoxetine and Abilify.  Take lorazepam for sleep. 3. History of migraine on Topamax 200 mg at bedtime. 4. History of hyperlipidemia. 5. Normocytic normochromic anemia appears to be chronic.  Patient's takes B12 supplements after gastric bypass in 2010.  DVT prophylaxis: lovenox Code Status: full  Family Communication: none at bedside Disposition Plan: pending   Consultants:   ortho  Procedures:  IM fixation of L femur  Antimicrobials:  Anti-infectives (From admission, onward)   Start     Dose/Rate Route Frequency Ordered Stop   01/22/19 1500  ceFAZolin (ANCEF) IVPB 2g/100 mL premix     2 g 200 mL/hr over 30 Minutes Intravenous Every 6 hours  01/22/19 1154 01/23/19 0259   01/22/19 1200  ceFAZolin (ANCEF) IVPB 2g/100 mL premix  Status:  Discontinued     2 g 200 mL/hr over 30 Minutes Intravenous On call to O.R. 01/22/19 1151 01/22/19 1202   01/22/19 0710  ceFAZolin (ANCEF) 2-4 GM/100ML-% IVPB    Note to Pharmacy: Misty Howard   : cabinet override      01/22/19 0710 01/22/19 1914     Subjective: Doing ok post op  Objective: Vitals:   01/22/19 1153 01/22/19 1307 01/22/19 1403 01/22/19 1458  BP: 140/79 124/69 134/75 129/73  Pulse: (!) 102 (!) 112 (!) 106 (!) 104  Resp: 16 16 16 16   Temp: 98.2 F (36.8 C) 98 F (36.7 C) 97.7 F (36.5 C) 98.2 F (36.8 C)  TempSrc: Oral Oral Oral Oral  SpO2: 100%  97% 97%  Weight:      Height:        Intake/Output Summary (Last 24 hours) at 01/22/2019 1741 Last data filed at 01/22/2019 1724 Gross per 24 hour  Intake 1520 ml  Output 2025 ml  Net -505 ml   Filed Weights   01/21/19 2237  Weight: 57.6 kg    Examination:  General exam: Appears calm and comfortable  Respiratory system: Clear to auscultation. Respiratory effort normal. Cardiovascular system: S1 & S2 heard, RRR.  Gastrointestinal system: Abdomen is nondistended, soft and nontender. Central nervous system: Alert and oriented. No focal  neurological deficits. Extremities: LLE with intact dressing Skin: No rashes, lesions or ulcers Psychiatry: Judgement and insight appear normal. Mood & affect appropriate.     Data Reviewed: I have personally reviewed following labs and imaging studies  CBC: Recent Labs  Lab 01/21/19 2331 01/22/19 1025  WBC 7.7 10.4  NEUTROABS 6.4  --   HGB 11.4* 11.2*  HCT 36.3 35.3*  MCV 97.1 98.1  PLT 271 025   Basic Metabolic Panel: Recent Labs  Lab 01/21/19 2331 01/22/19 1025  NA 134* 131*  K 3.7 4.2  CL 104 101  CO2 22 21*  GLUCOSE 115* 111*  BUN 10 8  CREATININE 0.71 0.74  CALCIUM 7.9* 8.1*   GFR: Estimated Creatinine Clearance: 66.5 mL/min (by C-G formula based on SCr of  0.74 mg/dL). Liver Function Tests: No results for input(s): AST, ALT, ALKPHOS, BILITOT, PROT, ALBUMIN in the last 168 hours. No results for input(s): LIPASE, AMYLASE in the last 168 hours. No results for input(s): AMMONIA in the last 168 hours. Coagulation Profile: No results for input(s): INR, PROTIME in the last 168 hours. Cardiac Enzymes: No results for input(s): CKTOTAL, CKMB, CKMBINDEX, TROPONINI in the last 168 hours. BNP (last 3 results) No results for input(s): PROBNP in the last 8760 hours. HbA1C: No results for input(s): HGBA1C in the last 72 hours. CBG: No results for input(s): GLUCAP in the last 168 hours. Lipid Profile: No results for input(s): CHOL, HDL, LDLCALC, TRIG, CHOLHDL, LDLDIRECT in the last 72 hours. Thyroid Function Tests: No results for input(s): TSH, T4TOTAL, FREET4, T3FREE, THYROIDAB in the last 72 hours. Anemia Panel: No results for input(s): VITAMINB12, FOLATE, FERRITIN, TIBC, IRON, RETICCTPCT in the last 72 hours. Sepsis Labs: No results for input(s): PROCALCITON, LATICACIDVEN in the last 168 hours.  Recent Results (from the past 240 hour(s))  SARS Coronavirus 2 (CEPHEID - Performed in [place] hospital lab), Hosp Order     Status: None   Collection Time: 01/21/19 11:38 PM   Specimen: Nasopharyngeal Swab  Result Value Ref Range Status   SARS Coronavirus 2 NEGATIVE NEGATIVE Final    Comment: (NOTE) If result is NEGATIVE SARS-CoV-2 target nucleic acids are NOT DETECTED. The SARS-CoV-2 RNA is generally detectable in upper and lower  respiratory specimens during the acute phase of infection. The lowest  concentration of SARS-CoV-2 viral copies this assay can detect is 250  copies / mL. Misty Howard negative result does not preclude SARS-CoV-2 infection  and should not be used as the sole basis for treatment or other  patient management decisions.  Misty Howard negative result may occur with  improper specimen collection / handling, submission of specimen other  than  nasopharyngeal swab, presence of viral mutation(s) within the  areas targeted by this assay, and inadequate number of viral copies  (<250 copies / mL). Misty Howard negative result must be combined with clinical  observations, patient history, and epidemiological information. If result is POSITIVE SARS-CoV-2 target nucleic acids are DETECTED. The SARS-CoV-2 RNA is generally detectable in upper and lower  respiratory specimens dur ing the acute phase of infection.  Positive  results are indicative of active infection with SARS-CoV-2.  Clinical  correlation with patient history and other diagnostic information is  necessary to determine patient infection status.  Positive results do  not rule out bacterial infection or co-infection with other viruses. If result is PRESUMPTIVE POSTIVE SARS-CoV-2 nucleic acids MAY BE PRESENT.   Misty Howard presumptive positive result was obtained on the submitted specimen  and confirmed on repeat  testing.  While 2019 novel coronavirus  (SARS-CoV-2) nucleic acids may be present in the submitted sample  additional confirmatory testing may be necessary for epidemiological  and / or clinical management purposes  to differentiate between  SARS-CoV-2 and other Sarbecovirus currently known to infect humans.  If clinically indicated additional testing with an alternate test  methodology [phone]) is advised. The SARS-CoV-2 RNA is generally  detectable in upper and lower respiratory sp ecimens during the acute  phase of infection. The expected result is Negative. Fact Sheet for Patients:  StrictlyIdeas.no Fact Sheet for Healthcare Providers: BankingDealers.co.za This test is not yet approved or cleared by the [place] FDA and has been authorized for detection and/or diagnosis of SARS-CoV-2 by FDA under an Emergency Use Authorization (EUA).  This EUA will remain in effect (meaning this test can be used) for the duration of  the COVID-19 declaration under Section 564(b)(1) of the Act, 21 U.S.C. section 360bbb-3(b)(1), unless the authorization is terminated or revoked sooner. Performed at [facility], [place] [address], [place], [place] [address]          Radiology Studies: Dg Chest 1 View  Result Date: 01/22/2019 CLINICAL DATA:  Preop, hip fracture EXAM: CHEST  1 VIEW COMPARISON:  None. FINDINGS: Heart and mediastinal contours are within normal limits. No focal opacities or effusions. No acute bony abnormality. IMPRESSION: No active disease. Electronically Signed   By: Misty Howard M.D.   On: 01/22/2019 00:44   Pelvis Portable  Result Date: 01/22/2019 CLINICAL DATA:  Left intertrochanteric femur ORIF. EXAM: PORTABLE PELVIS 1-2 VIEWS COMPARISON:  Intraoperative x-rays from same day. Bilateral hip x-rays from yesterday. FINDINGS: Interval gamma nail fixation of the left intertrochanteric femur fracture, in anatomic alignment. No dislocation. The hip joint spaces are preserved. Expected postsurgical changes in the soft tissues about the left hip. IMPRESSION: 1. Left intertrochanteric femur fracture ORIF without acute postoperative complication. Electronically Signed   By: Misty Howard M.D.   On: 01/22/2019 11:29   Dg Knee Complete 4 Views Left  Result Date: 01/22/2019 CLINICAL DATA:  Fractured hip.  Preop EXAM: LEFT KNEE - COMPLETE 4+ VIEW COMPARISON:  None. FINDINGS: No acute bony abnormality. Specifically, no fracture, subluxation, or dislocation. Early joint space narrowing in the patellofemoral compartment. No joint effusion. IMPRESSION: No acute bony abnormality. Electronically Signed   By: Misty Howard M.D.   On: 01/22/2019 00:44   Dg C-arm 1-60 Min-no Report  Result Date: 01/22/2019 Fluoroscopy was utilized by the requesting physician.  No radiographic interpretation.   Dg Hip Operative Unilat W Or W/o Pelvis Left  Result Date: 01/22/2019 CLINICAL DATA:  Intraoperative left hip ORIF.  EXAM: OPERATIVE left HIP (WITH PELVIS IF PERFORMED) 2 VIEWS fluoroscopic time is 33 seconds TECHNIQUE: Fluoroscopic spot image(s) were submitted for interpretation post-operatively. COMPARISON:  January 21, 2019 FINDINGS: Misty Howard left femoral compression screw has been placed without gross malalignment. IMPRESSION: Status post fixation of left intertrochanteric femoral fracture with left femoral compression screw placement without gross malalignment. Electronically Signed   By: Misty Howard M.D.   On: 01/22/2019 09:57   Dg Hips Bilat W Or Wo Pelvis 3-4 Views  Result Date: 01/21/2019 CLINICAL DATA:  Fall, lateral left hip pain EXAM: DG HIP (WITH OR WITHOUT PELVIS) 3-4V BILAT COMPARISON:  None. FINDINGS: There is Jediah Horger left femoral intertrochanteric fracture. No significant angulation. No subluxation or dislocation. Hip joints and SI joints are symmetric and unremarkable. IMPRESSION: Left femoral intertrochanteric fracture. Electronically Signed   By: Misty Bihari  [place] M.D.   On: 01/21/2019 23:19        Scheduled Meds:  ARIPiprazole  2 mg Oral QHS   chlorhexidine  60 mL Topical Once   docusate sodium  100 mg Oral BID   [START ON 01/23/2019] enoxaparin (LOVENOX) injection  40 mg Subcutaneous Q24H   feeding supplement  237 mL Oral BID BM   FLUoxetine  60 mg Oral QHS   LORazepam  2.5 mg Oral QHS   pantoprazole (PROTONIX) IV  40 mg Intravenous QHS   povidone-iodine  2 application Topical Once   senna  1 tablet Oral BID   topiramate  200 mg Oral QHS   Continuous Infusions:  ceFAZolin      ceFAZolin (ANCEF) IV 2 g (01/22/19 1514)     LOS: 0 days    Time spent: over 30 min    Fayrene Helper, MD [facility] Pager AMION  If 7PM-7AM, please contact night-coverage [email or web address] Password [facility] 01/22/2019, 5:41 PM

## 2019-01-22 NOTE — Discharge Instructions (Signed)
 Dr. Hanaan Gancarz Adult Hip & Knee Specialist [facility] [address] [place], [place] [address] [phone]   POSTOPERATIVE DIRECTIONS    Hip Rehabilitation, Guidelines Following Surgery   WEIGHT BEARING Weight bearing as tolerated with assist device (walker, cane, etc) as directed, use it as long as suggested by your surgeon or therapist, typically at least 4-6 weeks.   HOME CARE INSTRUCTIONS  Remove items at home which could result in a fall. This includes throw rugs or furniture in walking pathways.  Continue medications as instructed at time of discharge.  You may have some home medications which will be placed on hold until you complete the course of blood thinner medication.  4 days after discharge, you may start showering. No tub baths or soaking your incisions. Do not put on socks or shoes without following the instructions of your caregivers.   Sit on chairs with arms. Use the chair arms to help push yourself up when arising.  Arrange for the use of a toilet seat elevator so you are not sitting low.   Walk with walker as instructed.  You may resume a sexual relationship in one month or when given the OK by your caregiver.  Use walker as long as suggested by your caregivers.  Avoid periods of inactivity such as sitting longer than an hour when not asleep. This helps prevent blood clots.  You may return to work once you are cleared by your surgeon.  Do not drive a car for 6 weeks or until released by your surgeon.  Do not drive while taking narcotics.  Wear elastic stockings for two weeks following surgery during the day but you may remove then at night.  Make sure you keep all of your appointments after your operation with all of your doctors and caregivers. You should call the office at the above phone number and make an appointment for approximately two weeks after the date of your surgery. Please pick up a stool softener and laxative  for home use as long as you are requiring pain medications.  ICE to the affected hip every three hours for 30 minutes at a time and then as needed for pain and swelling. Continue to use ice on the hip for pain and swelling from surgery. You may notice swelling that will progress down to the foot and ankle.  This is normal after surgery.  Elevate the leg when you are not up walking on it.   It is important for you to complete the blood thinner medication as prescribed by your doctor.  Continue to use the breathing machine which will help keep your temperature down.  It is common for your temperature to cycle up and down following surgery, especially at night when you are not up moving around and exerting yourself.  The breathing machine keeps your lungs expanded and your temperature down.  RANGE OF MOTION AND STRENGTHENING EXERCISES  These exercises are designed to help you keep full movement of your hip joint. Follow your caregiver's or physical therapist's instructions. Perform all exercises about fifteen times, three times per day or as directed. Exercise both hips, even if you have had only one joint replacement. These exercises can be done on a training (exercise) mat, on the floor, on a table or on a bed. Use whatever works the best and is most comfortable for you. Use music or television while you are exercising so that the exercises are a pleasant break in your day. This   will make your life better with the exercises acting as a break in routine you can look forward to.  Lying on your back, slowly slide your foot toward your buttocks, raising your knee up off the floor. Then slowly slide your foot back down until your leg is straight again.  Lying on your back spread your legs as far apart as you can without causing discomfort.  Lying on your side, raise your upper leg and foot straight up from the floor as far as is comfortable. Slowly lower the leg and repeat.  Lying on your back, tighten up the  muscle in the front of your thigh (quadriceps muscles). You can do this by keeping your leg straight and trying to raise your heel off the floor. This helps strengthen the largest muscle supporting your knee.  Lying on your back, tighten up the muscles of your buttocks both with the legs straight and with the knee bent at a comfortable angle while keeping your heel on the floor.   SKILLED REHAB INSTRUCTIONS: If the patient is transferred to a skilled rehab facility following release from the hospital, a list of the current medications will be sent to the facility for the patient to continue.  When discharged from the skilled rehab facility, please have the facility set up the patient's Home Health Physical Therapy prior to being released. Also, the skilled facility will be responsible for providing the patient with their medications at time of release from the facility to include their pain medication and their blood thinner medication. If the patient is still at the rehab facility at time of the two week follow up appointment, the skilled rehab facility will also need to assist the patient in arranging follow up appointment in our office and any transportation needs.  MAKE SURE YOU:  Understand these instructions.  Will watch your condition.  Will get help right away if you are not doing well or get worse.  Pick up stool softner and laxative for home use following surgery while on pain medications. Daily dry dressing changes as needed. In 4 days, you may remove your dressings and begin taking showers - no tub baths or soaking the incisions. Continue to use ice for pain and swelling after surgery. Do not use any lotions or creams on the incision until instructed by your surgeon.   

## 2019-01-22 NOTE — Progress Notes (Signed)
Initial Nutrition Assessment  INTERVENTION:   Provide Ensure Surgery BID, each provides 330 kcal and 18g protein  NUTRITION DIAGNOSIS:   Increased nutrient needs related to hip fracture, post-op healing as evidenced by estimated needs.  GOAL:   Patient will meet greater than or equal to 90% of their needs  MONITOR:   PO intake, Supplement acceptance, Labs, Weight trends, I & O's  REASON FOR ASSESSMENT:   Consult Hip fracture protocol  ASSESSMENT:   63 y.o. female with a history of PTSD, anxiety, gastric bypass, and migraines who complains of left hip pain after she fell while going downstairs last night at home.  She had left hip pain and inability to weight-bear. She was admitted to the hospitalist service for perioperative or stratification and medical optimization.  **RD working remotely**  Patient is s/p Intramedullary fixation, Left femur this morning. Pt is still in PACU at this time. Pt had an Ensure Pre-surgery prior to surgery. Will add Ensure Surgery for pt to have now to aid in healing post-operatively.  Per weight records, pt has been steadily gaining weight since September 2019. Pt with h/o gastric bypass and anorexia nervosa. Is followed by [facility] for this.  Medications reviewed. Labs reviewed: Low Na   NUTRITION - FOCUSED PHYSICAL EXAM:  Unable to perform  Diet Order:   Diet Order            Diet NPO time specified Except for: Sips with Meds  Diet effective midnight        Diet regular Room service appropriate? Yes; Fluid consistency: Thin  Diet effective now              EDUCATION NEEDS:   Not appropriate for education at this time  Skin:  Skin Assessment: Reviewed RN Assessment  Last BM:  PTA  Height:   Ht Readings from Last 1 Encounters:  01/21/19 5\' 5"  (1.651 m)    Weight:   Wt Readings from Last 1 Encounters:  01/21/19 57.6 kg    Ideal Body Weight:  56.8 kg  BMI:  Body mass index is 21.13  kg/m.  Estimated Nutritional Needs:   Kcal:  1500-1700  Protein:  70-80g  Fluid:  1.7L/day   Clayton Bibles, MS, RD, LDN [facility] Pager: [phone] After Hours Pager: [phone]

## 2019-01-22 NOTE — ED Notes (Signed)
Paul from [facility] called. Michela Pitcher they will do the consent at the OR. Give pt CHG bath. OR will pick him up between 6:30 AM - 6:45 AM.

## 2019-01-22 NOTE — ED Notes (Signed)
ED TO INPATIENT HANDOFF REPORT  ED Nurse Name and Phone #: Judye Bos Name/Age/Gender Misty Howard 63 y.o. female Room/Bed: WA25/WA25  Code Status   Code Status: Not on file  Home/SNF/Other Home Patient oriented to: self, place, time and situation Is this baseline? Yes   Triage Complete: Triage complete  Chief Complaint Hip Injury; Fall  Triage Note [facility] transported pt from home to [facility] ED and reports the following:  Pt had a mechanical fall on left hip. Fell down 3-4 steps. Upon EMS arrived denies head, neck, back pain. Left hip deformity. Left leg inward rotation and elongation. Femur, tiba-fib intact.   Allergies Allergies  Allergen Reactions  . Codeine Phosphate Nausea And Vomiting  . Lipitor [Atorvastatin] Other (See Comments)    Muscle pain  . Morphine And Related Other (See Comments)    Causes pt to sweat and makes her crazy  . Nsaids Other (See Comments)    Gastric bypass surgery history  . Statins Other (See Comments)    Muscle pain  . Zocor [Simvastatin] Other (See Comments)    Muscle pain    Level of Care/Admitting Diagnosis ED Disposition    ED Disposition Condition Comment   Admit  Hospital Area: [place] [[id number]]  Level of Care: Med-Surg [16]  Covid Evaluation: N/A  Diagnosis: Closed left hip fracture, initial encounter [facility]) [[id number]]  Admitting Physician: Rise Patience [phone]  Attending Physician: Rise Patience [phone]  Estimated length of stay: past midnight tomorrow  Certification:: I certify this patient will need inpatient services for at least 2 midnights  PT Class (Do Not Modify): Inpatient [101]  PT Acc Code (Do Not Modify): Private [1]       B Medical/Surgery History Past Medical History:  Diagnosis Date  . Anxiety   . Bipolar disorder ([place])   . Cancer ([place])   . Depression   . Esophageal ring    s/p dilation 2003  . Headache(784.0)    frequent  . Hepatic steatosis   . Hyperlipidemia   .  LAE (left atrial enlargement)    per records on echo in 2008  . Migraine   . Protein calorie malnutrition ([place])    Past Surgical History:  Procedure Laterality Date  . ABDOMINAL HYSTERECTOMY  1991  . APPENDECTOMY  1972  . breast reduciton  1988  . CHOLECYSTECTOMY  1979  . gastric bypass surgery  03/2009  . right arm upper and lower    . TONSILLECTOMY AND ADENOIDECTOMY  2004     A IV Location/Drains/Wounds Patient Lines/Drains/Airways Status   Active Line/Drains/Airways    Name:   Placement date:   Placement time:   Site:   Days:   Peripheral IV 01/21/19 Right Antecubital   01/21/19    2135    Antecubital   1          Intake/Output Last 24 hours No intake or output data in the 24 hours ending 01/22/19 0644  Labs/Imaging Results for orders placed or performed during the hospital encounter of 01/21/19 (from the past 48 hour(s))  CBC with Differential/Platelet     Status: Abnormal   Collection Time: 01/21/19 11:31 PM  Result Value Ref Range   WBC 7.7 4.0 - 10.5 K/uL   RBC 3.74 (L) 3.87 - 5.11 MIL/uL   Hemoglobin 11.4 (L) 12.0 - 15.0 g/dL   HCT 36.3 36.0 - 46.0 %   MCV 97.1 80.0 - 100.0 fL   MCH 30.5 26.0 -  34.0 pg   MCHC 31.4 30.0 - 36.0 g/dL   RDW 13.2 11.5 - 15.5 %   Platelets 271 150 - 400 K/uL   nRBC 0.0 0.0 - 0.2 %   Neutrophils Relative % 84 %   Neutro Abs 6.4 1.7 - 7.7 K/uL   Lymphocytes Relative 10 %   Lymphs Abs 0.8 0.7 - 4.0 K/uL   Monocytes Relative 5 %   Monocytes Absolute 0.4 0.1 - 1.0 K/uL   Eosinophils Relative 0 %   Eosinophils Absolute 0.0 0.0 - 0.5 K/uL   Basophils Relative 0 %   Basophils Absolute 0.0 0.0 - 0.1 K/uL   Immature Granulocytes 1 %   Abs Immature Granulocytes 0.04 0.00 - 0.07 K/uL    Comment: Performed at [facility], [place] [address], [place], [place] [address]  Basic metabolic panel     Status: Abnormal   Collection Time: 01/21/19 11:31 PM  Result Value Ref Range   Sodium 134 (L) 135 - 145 mmol/L   Potassium  3.7 3.5 - 5.1 mmol/L   Chloride 104 98 - 111 mmol/L   CO2 22 22 - 32 mmol/L   Glucose, Bld 115 (H) 70 - 99 mg/dL   BUN 10 8 - 23 mg/dL   Creatinine, Ser 0.71 0.44 - 1.00 mg/dL   Calcium 7.9 (L) 8.9 - 10.3 mg/dL   GFR calc non Af Amer >60 >60 mL/min   GFR calc Af Amer >60 >60 mL/min   Anion gap 8 5 - 15    Comment: Performed at [facility], [place] [address], [place], [place] [address]  SARS Coronavirus 2 (CEPHEID - Performed in [facility] hospital lab), Hosp Order     Status: None   Collection Time: 01/21/19 11:38 PM   Specimen: Nasopharyngeal Swab  Result Value Ref Range   SARS Coronavirus 2 NEGATIVE NEGATIVE    Comment: (NOTE) If result is NEGATIVE SARS-CoV-2 target nucleic acids are NOT DETECTED. The SARS-CoV-2 RNA is generally detectable in upper and lower  respiratory specimens during the acute phase of infection. The lowest  concentration of SARS-CoV-2 viral copies this assay can detect is 250  copies / mL. A negative result does not preclude SARS-CoV-2 infection  and should not be used as the sole basis for treatment or other  patient management decisions.  A negative result may occur with  improper specimen collection / handling, submission of specimen other  than nasopharyngeal swab, presence of viral mutation(s) within the  areas targeted by this assay, and inadequate number of viral copies  (<250 copies / mL). A negative result must be combined with clinical  observations, patient history, and epidemiological information. If result is POSITIVE SARS-CoV-2 target nucleic acids are DETECTED. The SARS-CoV-2 RNA is generally detectable in upper and lower  respiratory specimens dur ing the acute phase of infection.  Positive  results are indicative of active infection with SARS-CoV-2.  Clinical  correlation with patient history and other diagnostic information is  necessary to determine patient infection status.  Positive results do  not rule out bacterial  infection or co-infection with other viruses. If result is PRESUMPTIVE POSTIVE SARS-CoV-2 nucleic acids MAY BE PRESENT.   A presumptive positive result was obtained on the submitted specimen  and confirmed on repeat testing.  While 2019 novel coronavirus  (SARS-CoV-2) nucleic acids may be present in the submitted sample  additional confirmatory testing may be necessary for epidemiological  and / or clinical management purposes  to  differentiate between  SARS-CoV-2 and other Sarbecovirus currently known to infect humans.  If clinically indicated additional testing with an alternate test  methodology [phone]) is advised. The SARS-CoV-2 RNA is generally  detectable in upper and lower respiratory sp ecimens during the acute  phase of infection. The expected result is Negative. Fact Sheet for Patients:  StrictlyIdeas.no Fact Sheet for Healthcare Providers: BankingDealers.co.za This test is not yet approved or cleared by the [place] FDA and has been authorized for detection and/or diagnosis of SARS-CoV-2 by FDA under an Emergency Use Authorization (EUA).  This EUA will remain in effect (meaning this test can be used) for the duration of the COVID-19 declaration under Section 564(b)(1) of the Act, 21 U.S.C. section 360bbb-3(b)(1), unless the authorization is terminated or revoked sooner. Performed at [facility], [place] [address] [place], [place] [address]    Dg Chest 1 View  Result Date: 01/22/2019 CLINICAL DATA:  Preop, hip fracture EXAM: CHEST  1 VIEW COMPARISON:  None. FINDINGS: Heart and mediastinal contours are within normal limits. No focal opacities or effusions. No acute bony abnormality. IMPRESSION: No active disease. Electronically Signed   By: Rolm Baptise M.D.   On: 01/22/2019 00:44   Dg Knee Complete 4 Views Left  Result Date: 01/22/2019 CLINICAL DATA:  Fractured hip.  Preop EXAM: LEFT KNEE - COMPLETE 4+  VIEW COMPARISON:  None. FINDINGS: No acute bony abnormality. Specifically, no fracture, subluxation, or dislocation. Early joint space narrowing in the patellofemoral compartment. No joint effusion. IMPRESSION: No acute bony abnormality. Electronically Signed   By: Rolm Baptise M.D.   On: 01/22/2019 00:44   Dg Hips Bilat W Or Wo Pelvis 3-4 Views  Result Date: 01/21/2019 CLINICAL DATA:  Fall, lateral left hip pain EXAM: DG HIP (WITH OR WITHOUT PELVIS) 3-4V BILAT COMPARISON:  None. FINDINGS: There is a left femoral intertrochanteric fracture. No significant angulation. No subluxation or dislocation. Hip joints and SI joints are symmetric and unremarkable. IMPRESSION: Left femoral intertrochanteric fracture. Electronically Signed   By: Rolm Baptise M.D.   On: 01/21/2019 23:19    Pending Labs [employer] (From admission, onward)    Start     Ordered   Signed and Held  HIV antibody (Routine Testing)  Tomorrow morning,   R     Signed and Held   Signed and Held  CBC  Tomorrow morning,   R     Signed and Held   Signed and Held  Basic metabolic panel  Tomorrow morning,   R     Signed and Held   Signed and Held  Type and screen [place]  Once,   R    Comments: [place]    Signed and Held          Vitals/Pain Today's Vitals   01/22/19 0430 01/22/19 0500 01/22/19 0530 01/22/19 0600  BP: 140/82 (!) 142/83 (!) 149/90 (!) 141/87  Pulse: 81 86 (!) 103 90  Resp: 18 (!) 22 15 13   SpO2: 96% 96% 99% 95%  Weight:      Height:      PainSc:        Isolation Precautions No active isolations  Medications Medications  topiramate (TOPAMAX) tablet 200 mg (200 mg Oral Given 01/22/19 0340)  LORazepam (ATIVAN) tablet 2.5 mg (2.5 mg Oral Given 01/22/19 0212)  FLUoxetine (PROZAC) capsule 60 mg (60 mg Oral Given 01/22/19 0338)  ARIPiprazole (ABILIFY) tablet 2 mg (2 mg Oral Given 01/22/19  5035)  pantoprazole (PROTONIX) injection 40 mg (40 mg Intravenous Given 01/22/19  0537)  fentaNYL (SUBLIMAZE) injection 25 mcg (25 mcg Intravenous Given 01/22/19 0537)  ondansetron (ZOFRAN) injection 4 mg (4 mg Intravenous Given 01/21/19 2317)  fentaNYL (SUBLIMAZE) injection 50 mcg (50 mcg Intravenous Given 01/21/19 2318)  fentaNYL (SUBLIMAZE) injection 25 mcg (25 mcg Intravenous Given 01/22/19 0213)  dexamethasone (DECADRON) 10 MG/ML injection (has no administration in time range)  ondansetron (ZOFRAN) 4 MG/2ML injection (has no administration in time range)  fentaNYL (SUBLIMAZE) 100 MCG/2ML injection (has no administration in time range)  midazolam (VERSED) 2 MG/2ML injection (has no administration in time range)  propofol (DIPRIVAN) 10 mg/mL bolus/IV push (has no administration in time range)    Mobility non-ambulatory High fall risk   Focused Assessments NA   R Recommendations: See Admitting Provider Note  Report given to:   Additional Notes: NA

## 2019-01-22 NOTE — Anesthesia Preprocedure Evaluation (Addendum)
Anesthesia Evaluation  Patient identified by MRN, date of birth, ID band Patient awake    Reviewed: Allergy & Precautions, NPO status , Patient's Chart, lab work & pertinent test results  Airway Mallampati: II  TM Distance: >3 FB Neck ROM: Full    Dental no notable dental hx. (+) Dental Advisory Given   Pulmonary neg pulmonary ROS, former smoker,    Pulmonary exam normal        Cardiovascular negative cardio ROS Normal cardiovascular exam     Neuro/Psych  Headaches, PSYCHIATRIC DISORDERS Anxiety Depression Bipolar Disorder    GI/Hepatic negative GI ROS, Neg liver ROS,   Endo/Other  negative endocrine ROS  Renal/GU negative Renal ROS     Musculoskeletal negative musculoskeletal ROS (+)   Abdominal   Peds  Hematology negative hematology ROS (+)   Anesthesia Other Findings Day of surgery medications reviewed with the patient.  Reproductive/Obstetrics                           Anesthesia Physical Anesthesia Plan  ASA: II  Anesthesia Plan: Spinal and MAC   Post-op Pain Management:    Induction:   PONV Risk Score and Plan: Ondansetron and Propofol infusion  Airway Management Planned: Natural Airway  Additional Equipment:   Intra-op Plan:   Post-operative Plan:   Informed Consent: I have reviewed the patients History and Physical, chart, labs and discussed the procedure including the risks, benefits and alternatives for the proposed anesthesia with the patient or authorized representative who has indicated his/her understanding and acceptance.     Dental advisory given  Plan Discussed with: CRNA and Anesthesiologist  Anesthesia Plan Comments:        Anesthesia Quick Evaluation

## 2019-01-22 NOTE — Anesthesia Procedure Notes (Signed)
Spinal  Patient location during procedure: OR Start time: 01/22/2019 8:28 AM End time: 01/22/2019 8:38 AM Staffing Anesthesiologist: Duane Boston, MD Performed: anesthesiologist  Preanesthetic Checklist Completed: patient identified, site marked, surgical consent, pre-op evaluation, timeout performed, IV checked, risks and benefits discussed and monitors and equipment checked Spinal Block Patient position: sitting Prep: DuraPrep Patient monitoring: heart rate, cardiac monitor, continuous pulse ox and blood pressure Approach: midline Location: L3-4 Injection technique: single-shot Needle Needle type: Sprotte  Needle gauge: 24 G Needle length: 9 cm Assessment Sensory level: T4 Additional Notes Functioning IV was confirmed and monitors were applied. Sterile prep and drape, including hand hygiene and sterile gloves were used. The patient was positioned and the spine was prepped. The skin was anesthetized with lidocaine.  Free flow of clear CSF was obtained prior to injecting local anesthetic into the CSF.  The spinal needle aspirated freely following injection.  The needle was carefully withdrawn.  The patient tolerated the procedure well.

## 2019-01-22 NOTE — ED Notes (Signed)
Attempted to change bedsheets that were soiled when she urinated earlier, however pt refused due to pain.  Placed pure-wick on pt and she was able to successfully urinate without leakage/soiling bedsheets.  Pt was also slid up in the bed and repositioned for comfort.

## 2019-01-22 NOTE — Progress Notes (Signed)
Consult received. Patient has unstable L IT femur fx.  Plan for IM Nail L femur later this am. NPO. Hold chemical DVT ppx. Will see patient just prior to surgery; consult note to follow.

## 2019-01-22 NOTE — ED Notes (Signed)
Went in to check on pt and obtain EKG and pt advised she needed to urinate urgently. Due to pt's limited mobility with her injury and lack of pain medicine, pt felt more comfortable using a female urinal than pure-wick.  Pt used the urinal successfully but with some leakage.  Advised pt that we would change the sheets once she received her pain medicine.

## 2019-01-22 NOTE — Anesthesia Procedure Notes (Signed)
Procedure Name: MAC Date/Time: 01/22/2019 8:30 AM Performed by: Eben Burow, CRNA Pre-anesthesia Checklist: Patient identified, Emergency Drugs available, Suction available, Patient being monitored and Timeout performed Oxygen Delivery Method: Simple face mask Dental Injury: Teeth and Oropharynx as per pre-operative assessment

## 2019-01-23 LAB — COMPREHENSIVE METABOLIC PANEL
ALT: 17 U/L (ref 0–44)
AST: 24 U/L (ref 15–41)
Albumin: 3.6 g/dL (ref 3.5–5.0)
Alkaline Phosphatase: 68 U/L (ref 38–126)
Anion gap: 8 (ref 5–15)
BUN: 9 mg/dL (ref 8–23)
CO2: 24 mmol/L (ref 22–32)
Calcium: 8.6 mg/dL — ABNORMAL LOW (ref 8.9–10.3)
Chloride: 106 mmol/L (ref 98–111)
Creatinine, Ser: 0.67 mg/dL (ref 0.44–1.00)
GFR calc Af Amer: >60 mL/min (ref >60)
GFR calc non Af Amer: >60 mL/min (ref >60)
Glucose, Bld: 121 mg/dL — ABNORMAL HIGH (ref 70–99)
Potassium: 3.9 mmol/L (ref 3.5–5.1)
Sodium: 138 mmol/L (ref 135–145)
Total Bilirubin: 0.4 mg/dL (ref 0.3–1.2)
Total Protein: 6.3 g/dL — ABNORMAL LOW (ref 6.5–8.1)

## 2019-01-23 LAB — CBC
HCT: 34.9 % — ABNORMAL LOW (ref 36.0–46.0)
Hemoglobin: 10.9 g/dL — ABNORMAL LOW (ref 12.0–15.0)
MCH: 31.1 pg (ref 26.0–34.0)
MCHC: 31.2 g/dL (ref 30.0–36.0)
MCV: 99.4 fL (ref 80.0–100.0)
Platelets: 242 10*3/uL (ref 150–400)
RBC: 3.51 MIL/uL — ABNORMAL LOW (ref 3.87–5.11)
RDW: 13.2 % (ref 11.5–15.5)
WBC: 7.3 10*3/uL (ref 4.0–10.5)
nRBC: 0 % (ref 0.0–0.2)

## 2019-01-23 LAB — TYPE AND SCREEN
ABO/RH(D): O POS
Antibody Screen: NEGATIVE

## 2019-01-23 LAB — ABO/RH: ABO/RH(D): O POS

## 2019-01-23 LAB — HIV ANTIBODY (ROUTINE TESTING W REFLEX): HIV Screen 4th Generation wRfx: NONREACTIVE

## 2019-01-23 LAB — GLUCOSE, CAPILLARY
Glucose-Capillary: 100 mg/dL — ABNORMAL HIGH (ref 70–99)
Glucose-Capillary: 117 mg/dL — ABNORMAL HIGH (ref 70–99)
Glucose-Capillary: 112 mg/dL — ABNORMAL HIGH (ref 70–99)
Glucose-Capillary: 120 mg/dL — ABNORMAL HIGH (ref 70–99)

## 2019-01-23 LAB — MAGNESIUM: Magnesium: 2.2 mg/dL (ref 1.7–2.4)

## 2019-01-23 MED ORDER — PANTOPRAZOLE SODIUM 40 MG PO TBEC
40.0000 mg | DELAYED_RELEASE_TABLET | Freq: Every day | ORAL | Status: DC
  Administered 2019-01-23 – 2019-01-24 (×2): 40 mg via ORAL
  Filled 2019-01-23 (×2): qty 1

## 2019-01-23 MED ORDER — POLYETHYLENE GLYCOL 3350 17 G PO PACK: 17.0000 g | PACK | Freq: Every day | ORAL | 0 refills | Status: DC

## 2019-01-23 MED ORDER — CYCLOBENZAPRINE HCL 5 MG PO TABS
5.0000 mg | ORAL_TABLET | Freq: Three times a day (TID) | ORAL | Status: DC | PRN
  Administered 2019-01-23 – 2019-01-25 (×6): 5 mg via ORAL
  Filled 2019-01-23 (×6): qty 1

## 2019-01-23 MED ORDER — ACETAMINOPHEN 500 MG PO TABS
1000.0000 mg | ORAL_TABLET | Freq: Three times a day (TID) | ORAL | Status: DC
  Administered 2019-01-23 – 2019-01-25 (×5): 1000 mg via ORAL
  Filled 2019-01-23 (×6): qty 2

## 2019-01-23 MED ORDER — OXYCODONE HCL 5 MG PO TABS: 5.0000 mg | ORAL_TABLET | ORAL | Status: DC | PRN

## 2019-01-23 MED ORDER — OXYCODONE HCL 5 MG PO TABS
10.0000 mg | ORAL_TABLET | ORAL | Status: DC | PRN
  Administered 2019-01-23 – 2019-01-25 (×9): 10 mg via ORAL
  Filled 2019-01-23 (×9): qty 2

## 2019-01-23 MED ORDER — ASPIRIN EC 81 MG PO TBEC: 81.0000 mg | DELAYED_RELEASE_TABLET | Freq: Two times a day (BID) | ORAL | 0 refills | Status: AC

## 2019-01-23 MED ORDER — HYDROCODONE-ACETAMINOPHEN 7.5-325 MG PO TABS: 1.0000 | ORAL_TABLET | Freq: Four times a day (QID) | ORAL | 0 refills | Status: DC | PRN

## 2019-01-23 NOTE — Discharge Summary (Signed)
Physician Discharge Summary  KENNEDEE KITZMILLER MWN:[id number] DOB: 1956/01/02 DOA: 01/21/2019  PCP: Patient, No Pcp Per  Admit date: 01/21/2019 Discharge date: 01/25/2019  Time spent: 40 minutes  Recommendations for Outpatient Follow-up:  1. Follow outpatient CBC/CMP 2. Follow up with orthopedics as outpatient  3. Follow up with PCP and orthopedics for osteoporosis eval and management   Discharge Diagnoses:  Principal Problem:   Closed left hip fracture, initial encounter ([place]) Active Problems:   HLD (hyperlipidemia)   Depression   Discharge Condition: stable  Diet recommendation: heart healthy  Filed Weights   01/21/19 2237  Weight: 57.6 kg    History of present illness:  Misty Howard Misty Howard 63 y.o.femalewithhistory of PTSD, anxiety, gastric bypass on B12 supplements, migraine on Topamax had Mykael Batz fall at home when patient was walking down the stairs. Patient states she slipped and missed the last days and fell onto the floor. Denies hitting her head or losing consciousness denies any chest pain or shortness of breath. She had Lacrecia Delval left side and was brought to the ER.  ED Course:X-rays in the ER showed left intertrochanteric fracture of the hip. On-call orthopedic surgeon Dr. Lyla Glassing was consulted. Plan is to have surgery later today. Chest x-ray was unremarkable EKG shows normal sinus rhythm. Labs show hemoglobin of 11.4 sodium 134 otherwise unremarkable. Patient admitted for hip fracture. COVID-19 was negative.  She was admitted for L hip fracture.  This was repaired surgically by orthopedics.  Post op course was c/b pain.  She's improved and is ready for d/c today.  See below for further details  Hospital Course:  1. Closed left hip fracture status post mechanical fall 1. Now s/p IM fixation of L femur 2. WBAT 3. Lovenox in house and d/c on ASA 81 mg PO BID for DVT ppx 4. Pain control - initially planned for d/c 7/4, but pt had worsening pain.  Will change to oxycodone  and schedule APAP, continue to monitor.  (rx sent for Norco canceled, spoke to [facility] today, 7/5).  D/c on oxycodone. 5. PT - home health PT at d/c, 3 in 1 and rolling walker 6. L knee pain, negative plain films, continue to monitor - f/u with ortho if no improvement  2. History of PTSD on fluoxetine and Abilify. Take lorazepam for sleep. 3. History of migraine on Topamax 200 mg at bedtime. 4. History of hyperlipidemia. 5. Normocytic normochromic anemia appears to be chronic. Patient's takes B12 supplements after gastric bypass in 2010.  Procedures: IM fixation of L femur  Consultations:  ortho  Discharge Exam: Vitals:   01/25/19 0415 01/25/19 1422  BP: 102/61 128/84  Pulse: (!) 103 (!) 108  Resp: 16 16  Temp: 98.1 F (36.7 C) 98.4 F (36.9 C)  SpO2: 96% 100%   Feels better. Thinks she's ready for discharge today. Still some L knee pain, but not as bad as yesterday.  General: No acute distress. Cardiovascular: Heart sounds show Jennine Peddy regular rate, and rhythm. Lungs: Clear to auscultation bilaterally Abdomen: Soft, nontender, nondistended  Neurological: Alert and oriented 3. Moves all extremities 4. Cranial nerves II through XII grossly intact. Skin: Warm and dry. No rashes or lesions. Extremities: LLE with intact dressing.  Knee without impressive edema, mildly ttp to lateral joint line.  No warmth.   Discharge Instructions   Discharge Instructions    Call MD for:  difficulty breathing, headache or visual disturbances   Complete by: As directed    Call MD for:  difficulty breathing, headache or visual disturbances   Complete by: As directed    Call MD for:  extreme fatigue   Complete by: As directed    Call MD for:  extreme fatigue   Complete by: As directed    Call MD for:  hives   Complete by: As directed    Call MD for:  persistant dizziness or light-headedness   Complete by: As directed    Call MD for:  persistant dizziness or light-headedness    Complete by: As directed    Call MD for:  persistant nausea and vomiting   Complete by: As directed    Call MD for:  persistant nausea and vomiting   Complete by: As directed    Call MD for:  redness, tenderness, or signs of infection (pain, swelling, redness, odor or green/yellow discharge around incision site)   Complete by: As directed    Call MD for:  redness, tenderness, or signs of infection (pain, swelling, redness, odor or green/yellow discharge around incision site)   Complete by: As directed    Call MD for:  severe uncontrolled pain   Complete by: As directed    Call MD for:  severe uncontrolled pain   Complete by: As directed    Call MD for:  temperature >100.4   Complete by: As directed    Call MD for:  temperature >100.4   Complete by: As directed    Diet - low sodium heart healthy   Complete by: As directed    Diet - low sodium heart healthy   Complete by: As directed    Diet - low sodium heart healthy   Complete by: As directed    Discharge instructions   Complete by: As directed    You were seen for [name] left hip fracture.  This was surgically repaired by orthopedics.  You are weight bearing as tolerated.  Please follow up with orthopedics as an outpatient as scheduled.  Follow up with your PCP for further work up and evaluation and treatment of osteoporosis.  Return for new, recurrent, or worsening symptoms.  Please ask your PCP to request records from this hospitalization so they know what was done and what the next steps will be.   Increase activity slowly   Complete by: As directed    Increase activity slowly   Complete by: As directed    Increase activity slowly   Complete by: As directed      Allergies as of 01/25/2019      Reactions   Codeine Phosphate Nausea And Vomiting   Lipitor [atorvastatin] Other (See Comments)   Muscle pain   Morphine And Related Other (See Comments)   Causes pt to sweat and makes her crazy   Nsaids Other (See Comments)    Gastric bypass surgery history   Statins Other (See Comments)   Muscle pain   Zocor [simvastatin] Other (See Comments)   Muscle pain      Medication List    TAKE these medications   ARIPiprazole 2 MG tablet Commonly known as: ABILIFY Take 1 tablet (2 mg total) by mouth daily. What changed: when to take this   aspirin EC 81 MG tablet Take 1 tablet (81 mg total) by mouth 2 (two) times Yarelli Decelles day.   Centrum Silver 50+Women Tabs Take 1 tablet by mouth at bedtime.   cyclobenzaprine 5 MG tablet Commonly known as: FLEXERIL Can use one tablet up to twice daily only if needed for back spasms.  FLUoxetine 20 MG capsule Commonly known as: PROZAC Take 3 capsules (60 mg total) by mouth at bedtime.   LORazepam 1 MG tablet Commonly known as: ATIVAN Take 2.5 tablets (2.5 mg total) by mouth at bedtime for 30 days.   oxyCODONE 5 MG immediate release tablet Commonly known as: Oxy IR/ROXICODONE Take 1-2 tablets (5-10 mg total) by mouth every 6 (six) hours as needed for up to 5 days for moderate pain.   pantoprazole 40 MG tablet Commonly known as: PROTONIX Take 40 mg by mouth at bedtime.   polyethylene glycol 17 g packet Commonly known as: MiraLax Take 17 g by mouth daily.   promethazine 50 MG tablet Commonly known as: PHENERGAN Take 50 mg by mouth every 6 (six) hours as needed for nausea or vomiting.   topiramate 100 MG tablet Commonly known as: TOPAMAX Take 2 tablets (200 mg total) by mouth at bedtime.   Vitamin B-12 5000 MCG Subl Place 1 tablet under the tongue at bedtime.            Durable Medical Equipment  (From admission, onward)         Start     Ordered   01/25/19 1451  DME 3-in-1  Once     01/25/19 1452   01/25/19 1451  For home use only DME Walker rolling  [facility])  Once    Question:  Patient needs Aldean Suddeth walker to treat with the following condition  Answer:  Hip fracture ([place])   01/25/19 1452   01/24/19 1022  DME 3-in-1  Once     01/24/19 1021   01/23/19  1306  For home use only DME Walker rolling  (Walkers)  Once    Question:  Patient needs Hailea Eaglin walker to treat with the following condition  Answer:  Hip fracture ([place])   01/23/19 1308         Allergies  Allergen Reactions  . Codeine Phosphate Nausea And Vomiting  . Lipitor [Atorvastatin] Other (See Comments)    Muscle pain  . Morphine And Related Other (See Comments)    Causes pt to sweat and makes her crazy  . Nsaids Other (See Comments)    Gastric bypass surgery history  . Statins Other (See Comments)    Muscle pain  . Zocor [Simvastatin] Other (See Comments)    Muscle pain   Follow-up Information    Swinteck, Aaron Edelman, MD. Schedule an appointment as soon as possible for Hari Casaus visit in 2 weeks.   Specialty: Orthopedic Surgery Why: For wound re-check Contact information: [address] [place] [address] [phone]        [facility]   Why: agency will provide home health physical therapy. agency will call you to schedule first visit. Contact information: [phone]           The results of significant diagnostics from this hospitalization (including imaging, microbiology, ancillary and laboratory) are listed below for reference.    Significant Diagnostic Studies: Dg Chest 1 View  Result Date: 01/22/2019 CLINICAL DATA:  Preop, hip fracture EXAM: CHEST  1 VIEW COMPARISON:  None. FINDINGS: Heart and mediastinal contours are within normal limits. No focal opacities or effusions. No acute bony abnormality. IMPRESSION: No active disease. Electronically Signed   By: Rolm Baptise M.D.   On: 01/22/2019 00:44   Pelvis Portable  Result Date: 01/22/2019 CLINICAL DATA:  Left intertrochanteric femur ORIF. EXAM: PORTABLE PELVIS 1-2 VIEWS COMPARISON:  Intraoperative x-rays from same day. Bilateral hip x-rays  from yesterday. FINDINGS: Interval gamma nail fixation of the left intertrochanteric femur fracture, in anatomic alignment. No dislocation. The  hip joint spaces are preserved. Expected postsurgical changes in the soft tissues about the left hip. IMPRESSION: 1. Left intertrochanteric femur fracture ORIF without acute postoperative complication. Electronically Signed   By: Titus Dubin M.D.   On: 01/22/2019 11:29   Dg Knee Complete 4 Views Left  Result Date: 01/22/2019 CLINICAL DATA:  Fractured hip.  Preop EXAM: LEFT KNEE - COMPLETE 4+ VIEW COMPARISON:  None. FINDINGS: No acute bony abnormality. Specifically, no fracture, subluxation, or dislocation. Early joint space narrowing in the patellofemoral compartment. No joint effusion. IMPRESSION: No acute bony abnormality. Electronically Signed   By: Rolm Baptise M.D.   On: 01/22/2019 00:44   Dg C-arm 1-60 Min-no Report  Result Date: 01/22/2019 Fluoroscopy was utilized by the requesting physician.  No radiographic interpretation.   Dg Hip Operative Unilat W Or W/o Pelvis Left  Result Date: 01/22/2019 CLINICAL DATA:  Intraoperative left hip ORIF. EXAM: OPERATIVE left HIP (WITH PELVIS IF PERFORMED) 2 VIEWS fluoroscopic time is 33 seconds TECHNIQUE: Fluoroscopic spot image(s) were submitted for interpretation post-operatively. COMPARISON:  January 21, 2019 FINDINGS: Allsion Nogales left femoral compression screw has been placed without gross malalignment. IMPRESSION: Status post fixation of left intertrochanteric femoral fracture with left femoral compression screw placement without gross malalignment. Electronically Signed   By: Abelardo Diesel M.D.   On: 01/22/2019 09:57   Dg Hips Bilat W Or Wo Pelvis 3-4 Views  Result Date: 01/21/2019 CLINICAL DATA:  Fall, lateral left hip pain EXAM: DG HIP (WITH OR WITHOUT PELVIS) 3-4V BILAT COMPARISON:  None. FINDINGS: There is Myrian Botello left femoral intertrochanteric fracture. No significant angulation. No subluxation or dislocation. Hip joints and SI joints are symmetric and unremarkable. IMPRESSION: Left femoral intertrochanteric fracture. Electronically Signed   By: Rolm Baptise M.D.    On: 01/21/2019 23:19    Microbiology: Recent Results (from the past 240 hour(s))  SARS Coronavirus 2 (CEPHEID - Performed in [place] hospital lab), Hosp Order     Status: None   Collection Time: 01/21/19 11:38 PM   Specimen: Nasopharyngeal Swab  Result Value Ref Range Status   SARS Coronavirus 2 NEGATIVE NEGATIVE Final    Comment: (NOTE) If result is NEGATIVE SARS-CoV-2 target nucleic acids are NOT DETECTED. The SARS-CoV-2 RNA is generally detectable in upper and lower  respiratory specimens during the acute phase of infection. The lowest  concentration of SARS-CoV-2 viral copies this assay can detect is 250  copies / mL. Joyous Gleghorn negative result does not preclude SARS-CoV-2 infection  and should not be used as the sole basis for treatment or other  patient management decisions.  Eldred Lievanos negative result may occur with  improper specimen collection / handling, submission of specimen other  than nasopharyngeal swab, presence of viral mutation(s) within the  areas targeted by this assay, and inadequate number of viral copies  (<250 copies / mL). Meldon Hanzlik negative result must be combined with clinical  observations, patient history, and epidemiological information. If result is POSITIVE SARS-CoV-2 target nucleic acids are DETECTED. The SARS-CoV-2 RNA is generally detectable in upper and lower  respiratory specimens dur ing the acute phase of infection.  Positive  results are indicative of active infection with SARS-CoV-2.  Clinical  correlation with patient history and other diagnostic information is  necessary to determine patient infection status.  Positive results do  not rule out bacterial infection or co-infection with other viruses. If result is  PRESUMPTIVE POSTIVE SARS-CoV-2 nucleic acids MAY BE PRESENT.   Noelani Harbach presumptive positive result was obtained on the submitted specimen  and confirmed on repeat testing.  While 2019 novel coronavirus  (SARS-CoV-2) nucleic acids may be present in the  submitted sample  additional confirmatory testing may be necessary for epidemiological  and / or clinical management purposes  to differentiate between  SARS-CoV-2 and other Sarbecovirus currently known to infect humans.  If clinically indicated additional testing with an alternate test  methodology [phone]) is advised. The SARS-CoV-2 RNA is generally  detectable in upper and lower respiratory sp ecimens during the acute  phase of infection. The expected result is Negative. Fact Sheet for Patients:  StrictlyIdeas.no Fact Sheet for Healthcare Providers: BankingDealers.co.za This test is not yet approved or cleared by the [place] FDA and has been authorized for detection and/or diagnosis of SARS-CoV-2 by FDA under an Emergency Use Authorization (EUA).  This EUA will remain in effect (meaning this test can be used) for the duration of the COVID-19 declaration under Section 564(b)(1) of the Act, 21 U.S.C. section 360bbb-3(b)(1), unless the authorization is terminated or revoked sooner. Performed at [facility], [place] [address], [place], [place] [address]      Labs: Basic Metabolic Panel: Recent Labs  Lab 01/21/19 2331 01/22/19 1025 01/23/19 0251 01/24/19 0317 01/25/19 0310  NA 134* 131* 138 137 136  K 3.7 4.2 3.9 3.2* 3.9  CL 104 101 106 107 107  CO2 22 21* 24 25 24   GLUCOSE 115* 111* 121* 114* 121*  BUN 10 8 9 12 12   CREATININE 0.71 0.74 0.67 0.56 0.62  CALCIUM 7.9* 8.1* 8.6* 8.0* 8.3*  MG  --   --  2.2 2.1 2.1   Liver Function Tests: Recent Labs  Lab 01/23/19 0251 01/24/19 0317 01/25/19 0310  AST 24 21 15   ALT 17 14 12   ALKPHOS 68 61 58  BILITOT 0.4 0.3 0.6  PROT 6.3* 6.1* 6.2*  ALBUMIN 3.6 3.3* 3.1*   No results for input(s): LIPASE, AMYLASE in the last 168 hours. No results for input(s): AMMONIA in the last 168 hours. CBC: Recent Labs  Lab 01/21/19 2331 01/22/19 1025 01/23/19 0251  01/24/19 0317 01/25/19 0310  WBC 7.7 10.4 7.3 8.6 7.2  NEUTROABS 6.4  --   --   --   --   HGB 11.4* 11.2* 10.9* 10.2* 9.3*  HCT 36.3 35.3* 34.9* 32.6* 30.6*  MCV 97.1 98.1 99.4 99.7 99.7  PLT 271 255 242 217 220   Cardiac Enzymes: No results for input(s): CKTOTAL, CKMB, CKMBINDEX, TROPONINI in the last 168 hours. BNP: BNP (last 3 results) No results for input(s): BNP in the last 8760 hours.  ProBNP (last 3 results) No results for input(s): PROBNP in the last 8760 hours.  CBG: Recent Labs  Lab 01/23/19 0132 01/23/19 0759 01/23/19 1602 01/23/19 2256  GLUCAP 100* 117* 112* 120*       Signed:  Fayrene Helper MD.  [facility] 01/25/2019, 3:18 PM

## 2019-01-23 NOTE — Progress Notes (Signed)
**Note De-Identified vi Obfusction** PROGRESS NOTE    Misty Howard  YKZ:[id number] DOB: Jnury 06, 1957 DOA: 01/21/2019 PCP: Ptient, No Pcp Per   Brief Nrrtive:  Misty Howard is  63 y.o. 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  Ptient sttes she slipped nd missed the lst dys nd fell onto the floor.  Denies hitting her hed or losing consciousness denies ny chest pin or shortness of breth.  She hd  left side nd ws brought to the ER.  ED Course: X-rys in the ER showed left intertrochnteric frcture of the hip.  On-cll orthopedic surgeon Dr. Lyl Glssing ws consulted.  Pln is to hve surgery lter tody.  Chest x-ry ws unremrkble EKG shows norml sinus rhythm.  Lbs show hemoglobin of 11.4 sodium 134 otherwise unremrkble.  Ptient dmitted for hip frcture.  COVID-19 ws negtive.   Assessment & Pln:   Principl Problem:   Closed left hip frcture, initil encounter ([place]) Active Problems:   HLD (hyperlipidemi)   Depression   1. Closed left hip frcture sttus post mechnicl fll  1. Now s/p IM fixtion of L femur 2. WBAT 3. Lovenox in house nd d/c on ASA 81 mg PO BID for DVT ppx 4. Pin control - initilly plnned for d/c tody, but pt hd worsening pin.  Will chnge to oxycodone nd schedule APAP, continue to monitor. 5. PT     2. History of PTSD on fluoxetine nd Abilify.  Tke lorzepm for sleep. 3. History of migrine on Topmx 200 mg t bedtime. 4. History of hyperlipidemi. 5. 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  Ptient's tkes B12 supplements fter gstric bypss in 2010.  DVT prophylxis: lovenox Code Sttus: full  Fmily Communiction: none t bedside Disposition Pln: pending   Consultnts:   ortho  Procedures:  IM fixtion of L femur  Antimicrobils:  Anti-infectives (From dmission, onwrd)   Strt     Dose/Rte Route Frequency Ordered  Stop   01/22/19 1500  ceFAZolin (ANCEF) IVPB 2g/100 mL premix     2 g 200 mL/hr over 30 Minutes Intrvenous Every 6 hours 01/22/19 1154 01/22/19 2200   01/22/19 1200  ceFAZolin (ANCEF) IVPB 2g/100 mL premix  Sttus:  Discontinued     2 g 200 mL/hr over 30 Minutes Intrvenous On cll to O.R. 01/22/19 1151 01/22/19 1202   01/22/19 0710  ceFAZolin (ANCEF) 2-4 GM/100ML-% IVPB    Note to Phrmcy: Jefm Miles   : cbinet override      01/22/19 0710 01/22/19 1914     Subjective: Ws doing well this morning Got worse this fternoon nd pin worsened Discussed with pt, ws initilly plnning for d/c nd d/c order plced, but she ws in pin when this hppened unfortuntely.  Clled nd pologized, they thought we were trying to kick out, but discussed I thought she ws doing well nd ws redy for d/c, but we'll pln for djustment of pin meds nd continued dmission for better pin control.  Objective: Vitls:   01/23/19 0138 01/23/19 0555 01/23/19 0942 01/23/19 1330  BP: 124/67 118/64 104/69 118/68  Pulse: 89 94 (!) 105 (!) 102  Resp: 16 16 16 17   Temp: 97.8 F (36.6 C) 97.6 F (36.4 C) 98.6 F (37 C) 98.2 F (36.8 C)  TempSrc: Orl Orl Orl Orl  SpO2: 97% 97% 97% 90%  Weight:      Height: **Note De-Identified vi Obfusction** Intke/Output Summry (Lst 24 hours) t 01/23/2019 1405 Lst dt filed t 01/23/2019 1328 Gross per 24 hour  Intke 1257.56 ml  Output 1950 ml  Net -692.44 ml   Filed Weights   01/21/19 2237  Weight: 57.6 kg    Exmintion:  Generl: No cute distress. Crdiovsculr: Hert sounds show  regulr rte, nd rhythm.  Lungs: Cler to usculttion bilterlly. bdomen: Soft, nontender, nondistended  Neurologicl: lert nd oriented 3. Moves ll extremities 4 . Crnil nerves II through XII grossly intct. Skin: Wrm nd dry. No rshes or lesions. Extremities: LLE with dressing intct   Dt Reviewed: I hve personlly reviewed following lbs nd imging studies  CBC:  Recent Lbs  Lb 01/21/19 2331 01/22/19 1025 01/23/19 0251  WBC 7.7 10.4 7.3  NEUTROBS 6.4  --   --   HGB 11.4* 11.2* 10.9*  HCT 36.3 35.3* 34.9*  MCV 97.1 98.1 99.4  PLT 271 255 706   Bsic Metbolic Pnel: Recent Lbs  Lb 01/21/19 2331 01/22/19 1025 01/23/19 0251  N 134* 131* 138  K 3.7 4.2 3.9  CL 104 101 106  CO2 22 21* 24  GLUCOSE 115* 111* 121*  BUN 10 8 9   CRETININE 0.71 0.74 0.67  CLCIUM 7.9* 8.1* 8.6*  MG  --   --  2.2   GFR: Estimted Cretinine Clernce: 66.5 mL/min (by [place] formul bsed on SCr of 0.67 mg/dL). Liver Function Tests: Recent Lbs  Lb 01/23/19 0251  ST 24  LT 17  LKPHOS 68  BILITOT 0.4  PROT 6.3*  LBUMIN 3.6   No results for input(s): LIPSE, MYLSE in the lst 168 hours. No results for input(s): MMONI in the lst 168 hours. Cogultion Profile: No results for input(s): INR, PROTIME in the lst 168 hours. Crdic Enzymes: No results for input(s): CKTOTL, CKMB, CKMBINDEX, TROPONINI in the lst 168 hours. BNP (lst 3 results) No results for input(s): PROBNP in the lst 8760 hours. Hb1C: No results for input(s): HGB1C in the lst 72 hours. CBG: Recent Lbs  Lb 01/23/19 0132 01/23/19 0759  GLUCP 100* 117*   Lipid Profile: No results for input(s): CHOL, HDL, LDLCLC, TRIG, CHOLHDL, LDLDIRECT in the lst 72 hours. Thyroid Function Tests: No results for input(s): TSH, T4TOTL, FREET4, T3FREE, THYROIDB in the lst 72 hours. nemi Pnel: No results for input(s): VITMINB12, FOLTE, FERRITIN, TIBC, IRON, RETICCTPCT in the lst 72 hours. Sepsis Lbs: No results for input(s): PROCLCITON, LTICCIDVEN in the lst 168 hours.  Recent Results (from the pst 240 hour(s))  SRS Coronvirus 2 (CEPHEID - Performed in ie hospitl lb), Hosp Order     Sttus: None   Collection Time: 01/21/19 11:38 PM   Specimen: Nsophryngel Swb  Result Vlue Ref Rnge Sttus   SRS Coronvirus 2 NEGTIVE NEGTIVE Finl     Comment: (NOTE) If result is NEGTIVE SRS-CoV-2 trget nucleic cids re NOT DETECTED. The SRS-CoV-2 RN is generlly detectble in upper nd lower  respirtory specimens during the cute phse of infection. The lowest  concentrtion of SRS-CoV-2 virl copies this ssy cn detect is 250  copies / mL.  negtive result does not preclude SRS-CoV-2 infection  nd should not be used s the sole bsis for tretment or other  ptient mngement decisions.   negtive result my occur with  improper specimen collection / hndling, submission of specimen other  thn nsophryngel swb, presence of virl muttion(s) within the  res trgeted by this ssy, nd indequte number of virl copies  (<250 copies / mL).  negative result must be combined with clinical  observations, patient history, and epidemiological information. If result is POSITIVE SRS-CoV-2 target nucleic acids are DETECTED. The SRS-CoV-2 RN is generally detectable in upper and lower  respiratory specimens dur ing the acute phase of infection.  Positive  results are indicative of active infection with SRS-CoV-2.  Clinical  correlation with patient history and other diagnostic information is  necessary to determine patient infection status.  Positive results do  not rule out bacterial infection or co-infection with other viruses. If result is PRESUMPTIVE POSTIVE SRS-CoV-2 nucleic acids MY BE PRESENT.    presumptive positive result was obtained on the submitted specimen  and confirmed on repeat testing.  While 2019 novel coronavirus  (SRS-CoV-2) nucleic acids may be present in the submitted sample  additional confirmatory testing may be necessary for epidemiological  and / or clinical management purposes  to differentiate between  SRS-CoV-2 and other Sarbecovirus currently known to infect humans.  If clinically indicated additional testing with an alternate test  methodology [phone]) is advised. The SRS-CoV-2  RN is generally  detectable in upper and lower respiratory sp ecimens during the acute  phase of infection. The expected result is Negative. Fact Sheet for Patients:  StrictlyIdeas.no Fact Sheet for Healthcare Providers: BankingDealers.co.za This test is not yet approved or cleared by the [place] [facility] and has been authorized for detection and/or diagnosis of SRS-CoV-2 by FD under an Emergency Use uthorization (EU).  This EU will remain in effect (meaning this test can be used) for the duration of the COVID-19 declaration under Section 564(b)(1) of the ct, 21 U.S.C. section 360bbb-3(b)(1), unless the authorization is terminated or revoked sooner. Performed at [facility], [place] [address] [place], [place] [address]          Radiology Studies: Dg Chest 1 View  Result Date: 01/22/2019 CLINICL DT:  Preop, hip fracture EXM: CHEST  1 VIEW COMPRISON:  None. FINDINGS: Heart and mediastinal contours are within normal limits. No focal opacities or effusions. No acute bony abnormality. IMPRESSION: No active disease. Electronically Signed   By: Rolm Baptise M.D.   On: 01/22/2019 00:44   Pelvis Portable  Result Date: 01/22/2019 CLINICL DT:  Left intertrochanteric femur ORIF. EXM: PORTBLE PELVIS 1-2 VIEWS COMPRISON:  Intraoperative x-rays from same day. Bilateral hip x-rays from yesterday. FINDINGS: Interval gamma nail fixation of the left intertrochanteric femur fracture, in anatomic alignment. No dislocation. The hip joint spaces are preserved. Expected postsurgical changes in the soft tissues about the left hip. IMPRESSION: 1. Left intertrochanteric femur fracture ORIF without acute postoperative complication. Electronically Signed   By: Titus Dubin M.D.   On: 01/22/2019 11:29   Dg Knee Complete 4 Views Left  Result Date: 01/22/2019 CLINICL DT:  Fractured hip.  Preop EXM: LEFT KNEE - COMPLETE 4+ VIEW  COMPRISON:  None. FINDINGS: No acute bony abnormality. Specifically, no fracture, subluxation, or dislocation. Early joint space narrowing in the patellofemoral compartment. No joint effusion. IMPRESSION: No acute bony abnormality. Electronically Signed   By: Rolm Baptise M.D.   On: 01/22/2019 00:44   Dg C-arm 1-60 Min-no Report  Result Date: 01/22/2019 Fluoroscopy was utilized by the requesting physician.  No radiographic interpretation.   Dg Hip Operative Unilat W Or W/o Pelvis Left  Result Date: 01/22/2019 CLINICL DT:  Intraoperative left hip ORIF. EXM: OPERTIVE left HIP (WITH PELVIS IF PERFORMED) 2 VIEWS fluoroscopic time is 33 seconds TECHNIQUE: Fluoroscopic spot image(s) were submitted for interpretation post-operatively. **Note De-Identified vi Obfusction** COMPRISON:  January 21, 2019 FINDINGS:  left femorl compression screw hs been plced without gross mllignment. IMPRESSION: Sttus post fixtion of left intertrochnteric femorl frcture with left femorl compression screw plcement without gross mllignment. Electroniclly Signed   By: belrdo Diesel M.D.   On: 01/22/2019 09:57   Dg Hips Bilt W Or Wo Pelvis 3-4 Views  Result Dte: 01/21/2019 CLINICL DT:  Fll, lterl left hip pin EXM: DG HIP (WITH OR WITHOUT PELVIS) 3-4V BILT COMPRISON:  None. FINDINGS: There is  left femorl intertrochnteric frcture. No significnt ngultion. No subluxtion or disloction. Hip joints nd SI joints re symmetric nd unremrkble. IMPRESSION: Left femorl intertrochnteric frcture. Electroniclly Signed   By: Rolm Bptise M.D.   On: 01/21/2019 23:19        Scheduled Meds: . cetminophen  1,000 mg Orl Q8H  . RIPiprzole  2 mg Orl QHS  . chlorhexidine  60 mL Topicl Once  . docuste sodium  100 mg Orl BID  . enoxprin (LOVENOX) injection  40 mg Subcutneous Q24H  . feeding supplement  237 mL Orl BID BM  . FLUoxetine  60 mg Orl QHS  . LORzepm  2.5 mg Orl QHS  . pntoprzole  40 mg Orl QHS  .  povidone-iodine  2 ppliction Topicl Once  . senn  1 tblet Orl BID  . topirmte  200 mg Orl QHS   Continuous Infusions:    LOS: 1 dy    Time spent: over 30 min    Fyrene Helper, MD [facility]ION  If 7PM-7M, plese contct night-coverge [email or web address] Pssword [email or web address] 01/23/2019, 2:05 PM

## 2019-01-23 NOTE — Progress Notes (Signed)
The patient is receiving Protonix by the intravenous route.  Based on criteria approved by the Pharmacy and [facility], the medication is being converted to the equivalent oral dose form.  These criteria include: -No active GI bleeding -Able to tolerate diet of full liquids (or better) or tube feeding -Able to tolerate other medications by the oral or enteral route  If you have any questions about this conversion, please contact the Pharmacy Department (phone [phone]).  Thank you.  Dolly Rias RPh 01/23/2019, 9:23 AM

## 2019-01-23 NOTE — Progress Notes (Signed)
Subjective: 1 Day Post-Op Procedure(s) (LRB): INTRAMEDULLARY (IM) NAIL INTERTROCHANTRIC (Left)  Patient reports pain as moderate.  Tolerating POs well.  Admits to flatus.  Denies fever, chills, N/V, CP, SOB.  States that she struggled last night with muscle spasms and would like flexeril prescribed.  Eager to work with therapy.  Objective:   VITALS:  Temp:  [97.6 F (36.4 C)-98.4 F (36.9 C)] 97.6 F (36.4 C) (07/04 0555) Pulse Rate:  [76-112] 94 (07/04 0555) Resp:  [10-17] 16 (07/04 0555) BP: (112-141)/(64-79) 118/64 (07/04 0555) SpO2:  [97 %-100 %] 97 % (07/04 0555)  General: WDWN patient in NAD. Psych:  Appropriate mood and affect. Neuro:  A&O x 3, Moving all extremities, sensation intact to light touch HEENT:  EOMs intact Chest:  Even non-labored respirations Skin: Dressing C/D/I, no rashes or lesions Extremities: warm/dry, mild edema, no erythema or echymosis.  No lymphadenopathy. Pulses: Popliteus 2+ MSK:  ROM: lacks 5 degrees of TKE, MMT: able to perform quad set, (-) Homan's    LABS Recent Labs    01/21/19 2331 01/22/19 1025 01/23/19 0251  HGB 11.4* 11.2* 10.9*  WBC 7.7 10.4 7.3  PLT 271 255 242   Recent Labs    01/22/19 1025 01/23/19 0251  NA 131* 138  K 4.2 3.9  CL 101 106  CO2 21* 24  BUN 8 9  CREATININE 0.74 0.67  GLUCOSE 111* 121*   No results for input(s): LABPT, INR in the last 72 hours.   Assessment/Plan: 1 Day Post-Op Procedure(s) (LRB): INTRAMEDULLARY (IM) NAIL INTERTROCHANTRIC (Left)  Patient seen in rounds for Dr. Lyla Glassing WBAT LLE with rolling walker Up with therapy Prescribed flexeril 5 mg TID prn muscle spasm DVT ppx: lovenox with transition to 81 mg ASA BID upon D/C x 6 weeks. Disp: pending Plan for outpatient post-op visit with Dr. Lyla Glassing.   Mechele Claude PA-C [facility] Office:  [phone]

## 2019-01-23 NOTE — Progress Notes (Signed)
Physical Therapy Treatment Patient Details Name: Misty Howard MRN: [id number] DOB: 11-07-1955 Today's Date: 01/23/2019    History of Present Illness Pt s/p fall with L hip fx and IM nailing repair.  Pt with hx of PTSD, gastric bypass and Bipolar    PT Comments    Pt continues cooperative but severely limited this pm 2* elevated pain level with attempts to mobilize despite premed one hour prior to PT - RN aware and attending to pt.   Follow Up Recommendations  Home health PT     Equipment Recommendations  Rolling walker with 5" wheels    Recommendations for Other Services OT consult     Precautions / Restrictions Precautions Precautions: Fall Restrictions Weight Bearing Restrictions: No Other Position/Activity Restrictions: WBAT    Mobility  Bed Mobility Overal bed mobility: Needs Assistance Bed Mobility: Sit to Supine     Supine to sit: Min assist;Mod assist Sit to supine: Min assist;Mod assist;+2 for physical assistance;+2 for safety/equipment   General bed mobility comments: Increased time with cues for sequence and use of R LE to self assist  Transfers Overall transfer level: Needs assistance Equipment used: Rolling walker (2 wheeled) Transfers: Sit to/from Stand Sit to Stand: Min assist;Mod assist         General transfer comment: cues for LE management and use of UEs to self assist  Ambulation/Gait Ambulation/Gait assistance: Min assist Gait Distance (Feet): 8 Feet Assistive device: Rolling walker (2 wheeled) Gait Pattern/deviations: Step-to pattern;Step-through pattern;Decreased step length - right;Decreased step length - left;Shuffle;Trunk flexed Gait velocity: decr   General Gait Details: Increased time with cues for posture, position from RW and sequence.  DIstance ltd by pain   Stairs             Wheelchair Mobility    Modified Rankin (Stroke Patients Only)       Balance Overall balance assessment: Needs  assistance Sitting-balance support: No upper extremity supported;Feet supported Sitting balance-Leahy Scale: Good     Standing balance support: Bilateral upper extremity supported Standing balance-Leahy Scale: Poor                              Cognition Arousal/Alertness: Awake/alert Behavior During Therapy: WFL for tasks assessed/performed Overall Cognitive Status: Within Functional Limits for tasks assessed                                        Exercises General Exercises - Lower Extremity Ankle Circles/Pumps: AROM;Both;15 reps;Supine Quad Sets: AROM;Both;10 reps;Supine Heel Slides: AAROM;Left;20 reps;Supine Hip ABduction/ADduction: AAROM;Left;15 reps;Supine    General Comments        Pertinent Vitals/Pain Pain Assessment: 0-10 Pain Score: 10-Worst pain ever Pain Location: L hip Pain Descriptors / Indicators: Aching;Burning;Grimacing;Guarding;Sore;Throbbing;Spasm Pain Intervention(s): Limited activity within patient's tolerance;Monitored during session;Premedicated before session;Ice applied;Patient requesting pain meds-RN notified    Home Living Family/patient expects to be discharged to:: Private residence Living Arrangements: Spouse/significant other Available Help at Discharge: Family Type of Home: House Home Access: Stairs to enter Entrance Stairs-Rails: None Home Layout: Multi-level Home Equipment: None      Prior Function Level of Independence: Independent      Comments: Pt admits to hx of falls   PT Goals (current goals can now be found in the care plan section) Acute Rehab PT Goals Patient Stated Goal: Regain IND and not fall anymore  PT Goal Formulation: With patient Time For Goal Achievement: 01/30/19 Potential to Achieve Goals: Good Progress towards PT goals: Not progressing toward goals - comment(elevated pain despite premed)    Frequency    7X/week      PT Plan Current plan remains appropriate     Co-evaluation              AM-PAC PT "6 Clicks" Mobility   Outcome Measure  Help needed turning from your back to your side while in a flat bed without using bedrails?: A Lot Help needed moving from lying on your back to sitting on the side of a flat bed without using bedrails?: A Lot Help needed moving to and from a bed to a chair (including a wheelchair)?: A Lot Help needed standing up from a chair using your arms (e.g., wheelchair or bedside chair)?: A Lot Help needed to walk in hospital room?: A Lot Help needed climbing 3-5 steps with a railing? : A Lot 6 Click Score: 12    End of Session Equipment Utilized During Treatment: Gait belt Activity Tolerance: Patient limited by lethargy;Patient limited by pain Patient left: in bed;with call bell/phone within reach;with bed alarm set Nurse Communication: Mobility status PT Visit Diagnosis: Difficulty in walking, not elsewhere classified (R26.2)     Time: 2671-2458 PT Time Calculation (min) (ACUTE ONLY): 24 min  Charges:  $Gait Training: 8-22 mins $Therapeutic Exercise: 8-22 mins                     [place] Pager [phone] Office [phone]    Chace Bisch 01/23/2019, 2:29 PM

## 2019-01-23 NOTE — Progress Notes (Signed)
Pt had wanted to wait until after PT to remove catheter today. Pt did not do as well with walking as she would have liked, required a dose of dilaudid for breakthrough pain after PT, and had a episode of nausea. Offered pt the use of a Purewick catheter, but pt reports that it didn't work for her prior to surgery, and she was "sitting in a puddle of urine no matter how they adjusted it." Pt would prefer to keep Foley until early in the am to decrease pain with getting up in the night to void. Message sent to MD.

## 2019-01-23 NOTE — Progress Notes (Signed)
Physical Therapy Evaluation Patient Details Name: Misty Howard MRN: [id number] DOB: 1956/02/28 Today's Date: 01/23/2019   History of Present Illness  Pt s/p fall with L hip fx and IM nailing repair.  Pt with hx of PTSD, gastric bypass and Bipolar  Clinical Impression  Pt admitted as above and presenting with functional mobility limitations 2* decreased L LE strength/ROM and post op pain limiting functional mobility.  Pt should progress to dc home with family assist.    Follow Up Recommendations Home health PT    Equipment Recommendations  Rolling walker with 5" wheels    Recommendations for Other Services OT consult     Precautions / Restrictions Precautions Precautions: Fall Restrictions Weight Bearing Restrictions: No Other Position/Activity Restrictions: WBAT      Mobility  Bed Mobility Overal bed mobility: Needs Assistance Bed Mobility: Supine to Sit     Supine to sit: Min assist;Mod assist     General bed mobility comments: Increased time with cues for sequence and use of R LE to self assist  Transfers Overall transfer level: Needs assistance Equipment used: Rolling walker (2 wheeled) Transfers: Sit to/from Stand Sit to Stand: Min assist;Mod assist         General transfer comment: cues for LE management and use of UEs to self assist  Ambulation/Gait Ambulation/Gait assistance: Min assist Gait Distance (Feet): 38 Feet Assistive device: Rolling walker (2 wheeled) Gait Pattern/deviations: Step-to pattern;Step-through pattern;Decreased step length - right;Decreased step length - left;Shuffle;Trunk flexed Gait velocity: decr   General Gait Details: Increased time with cues for posture, position from RW and initial sequence  Stairs            Wheelchair Mobility    Modified Rankin (Stroke Patients Only)       Balance Overall balance assessment: Needs assistance Sitting-balance support: No upper extremity supported;Feet supported Sitting  balance-Leahy Scale: Good     Standing balance support: Bilateral upper extremity supported Standing balance-Leahy Scale: Poor                               Pertinent Vitals/Pain Pain Assessment: 0-10 Pain Score: 7  Pain Location: L hip Pain Descriptors / Indicators: Aching;Sore Pain Intervention(s): Limited activity within patient's tolerance;Monitored during session;Premedicated before session;Ice applied    Home Living Family/patient expects to be discharged to:: Private residence Living Arrangements: Spouse/significant other Available Help at Discharge: Family Type of Home: House Home Access: Stairs to enter Entrance Stairs-Rails: None Technical brewer of Steps: 3+3 Home Layout: Multi-level Home Equipment: None      Prior Function Level of Independence: Independent         Comments: Pt admits to hx of falls     Hand Dominance        Extremity/Trunk Assessment   Upper Extremity Assessment Upper Extremity Assessment: Overall WFL for tasks assessed    Lower Extremity Assessment Lower Extremity Assessment: LLE deficits/detail LLE Deficits / Details: 2/5 strength at hip with AAROM at hip to 90 flex and 15 abd    Cervical / Trunk Assessment Cervical / Trunk Assessment: Normal  Communication   Communication: No difficulties  Cognition Arousal/Alertness: Awake/alert Behavior During Therapy: WFL for tasks assessed/performed Overall Cognitive Status: Within Functional Limits for tasks assessed  General Comments      Exercises General Exercises - Lower Extremity Ankle Circles/Pumps: AROM;Both;15 reps;Supine Quad Sets: AROM;Both;10 reps;Supine Heel Slides: AAROM;Left;20 reps;Supine Hip ABduction/ADduction: AAROM;Left;15 reps;Supine   Assessment/Plan    PT Assessment Patient needs continued PT services  PT Problem List Decreased strength;Decreased range of motion;Decreased activity  tolerance;Decreased mobility;Pain;Decreased knowledge of use of DME       PT Treatment Interventions DME instruction;Gait training;Stair training;Functional mobility training;Therapeutic activities;Therapeutic exercise;Patient/family education    PT Goals (Current goals can be found in the Care Plan section)  Acute Rehab PT Goals Patient Stated Goal: Regain IND and not fall anymore PT Goal Formulation: With patient Time For Goal Achievement: 01/30/19 Potential to Achieve Goals: Good    Frequency 7X/week   Barriers to discharge        Co-evaluation               AM-PAC PT "6 Clicks" Mobility  Outcome Measure Help needed turning from your back to your side while in a flat bed without using bedrails?: A Lot Help needed moving from lying on your back to sitting on the side of a flat bed without using bedrails?: A Little Help needed moving to and from a bed to a chair (including a wheelchair)?: A Lot Help needed standing up from a chair using your arms (e.g., wheelchair or bedside chair)?: A Lot Help needed to walk in hospital room?: A Little Help needed climbing 3-5 steps with a railing? : A Lot 6 Click Score: 14    End of Session Equipment Utilized During Treatment: Gait belt Activity Tolerance: Patient tolerated treatment well;Patient limited by lethargy;Patient limited by pain Patient left: in chair;with call bell/phone within reach;with chair alarm set Nurse Communication: Mobility status PT Visit Diagnosis: Difficulty in walking, not elsewhere classified (R26.2)    Time: 2505-3976 PT Time Calculation (min) (ACUTE ONLY): 42 min   Charges:   PT Evaluation $PT Eval Low Complexity: 1 Low PT Treatments $Gait Training: 8-22 mins $Therapeutic Exercise: 8-22 mins        [place] Pager [phone] Office [phone]   Jannat Rosemeyer 01/23/2019, 12:16 PM

## 2019-01-23 NOTE — Progress Notes (Signed)
OT Cancellation Note  Patient Details Name: Misty Howard MRN: [id number] DOB: [birth year]   Cancelled Treatment:    Reason Eval/Treat Not Completed: Pain limiting ability to participate  [place] 01/23/2019, 1:16 PM  Lesle Chris, OTR/L Acute Rehabilitation Services [phone] [facility] pager [phone] office 01/23/2019

## 2019-01-24 LAB — COMPREHENSIVE METABOLIC PANEL
ALT: 14 U/L (ref 0–44)
AST: 21 U/L (ref 15–41)
Albumin: 3.3 g/dL — ABNORMAL LOW (ref 3.5–5.0)
Alkaline Phosphatase: 61 U/L (ref 38–126)
Anion gap: 5 (ref 5–15)
BUN: 12 mg/dL (ref 8–23)
CO2: 25 mmol/L (ref 22–32)
Calcium: 8 mg/dL — ABNORMAL LOW (ref 8.9–10.3)
Chloride: 107 mmol/L (ref 98–111)
Creatinine, Ser: 0.56 mg/dL (ref 0.44–1.00)
GFR calc Af Amer: >60 mL/min (ref >60)
GFR calc non Af Amer: >60 mL/min (ref >60)
Glucose, Bld: 114 mg/dL — ABNORMAL HIGH (ref 70–99)
Potassium: 3.2 mmol/L — ABNORMAL LOW (ref 3.5–5.1)
Sodium: 137 mmol/L (ref 135–145)
Total Bilirubin: 0.3 mg/dL (ref 0.3–1.2)
Total Protein: 6.1 g/dL — ABNORMAL LOW (ref 6.5–8.1)

## 2019-01-24 LAB — CBC
HCT: 32.6 % — ABNORMAL LOW (ref 36.0–46.0)
Hemoglobin: 10.2 g/dL — ABNORMAL LOW (ref 12.0–15.0)
MCH: 31.2 pg (ref 26.0–34.0)
MCHC: 31.3 g/dL (ref 30.0–36.0)
MCV: 99.7 fL (ref 80.0–100.0)
Platelets: 217 10*3/uL (ref 150–400)
RBC: 3.27 MIL/uL — ABNORMAL LOW (ref 3.87–5.11)
RDW: 13.3 % (ref 11.5–15.5)
WBC: 8.6 10*3/uL (ref 4.0–10.5)
nRBC: 0 % (ref 0.0–0.2)

## 2019-01-24 LAB — MAGNESIUM: Magnesium: 2.1 mg/dL (ref 1.7–2.4)

## 2019-01-24 MED ORDER — POTASSIUM CHLORIDE CRYS ER 20 MEQ PO TBCR
40.0000 meq | EXTENDED_RELEASE_TABLET | ORAL | Status: AC
  Administered 2019-01-24 (×2): 40 meq via ORAL
  Filled 2019-01-24 (×2): qty 2

## 2019-01-24 NOTE — TOC Initial Note (Signed)
Transition of Care [facility]) - Initial/Assessment Note    Patient Details  Name: Misty Howard MRN: [id number] Date of Birth: Oct 12, 1955  Transition of Care [facility]) CM/SW Contact:    Joaquin Courts, RN Phone Number: 01/24/2019, 11:48 AM  Clinical Narrative:      CM spoke with patient at bedside. Patient [facility] with [facility] for HHPT. [employer] to deliver rolling walker and 3-in-1 to bedside for home use.               Expected Discharge Plan: [place] Barriers to Discharge: Continued Medical Work up   Patient Goals and CMS Choice Patient states their goals for this hospitalization and ongoing recovery are:: to go home CMS Medicare.gov Compare Post Acute Care list provided to:: Patient Choice offered to / list presented to : Patient  Expected Discharge Plan and Services Expected Discharge Plan: [place]   Discharge Planning Services: CM Consult Post Acute Care Choice: [place] arrangements for the past 2 months: Single Family Home Expected Discharge Date: 01/23/19               DME Arranged: Berta Minor rolling DME Agency: [employer] Date DME Agency Contacted: 01/24/19 Time DME Agency Contacted: 39 Representative spoke with at DME Agency: [place]: PT [place]: [place] ([place]) Date [place]: 01/24/19 Time [place]: 107 Representative spoke with at [place] Arrangements/Services Living arrangements for the past 2 months: [place] Lives with:: Spouse Patient language and need for interpreter reviewed:: Yes Do you feel safe going back to the place where you live?: Yes      Need for Family Participation in Patient Care: Yes (Comment) Care giver support system in place?: Yes (comment)   Criminal Activity/Legal Involvement Pertinent to Current Situation/Hospitalization: No - Comment as needed  Activities of Daily  Living Home Assistive Devices/Equipment: None ADL Screening (condition at time of admission) Patient's cognitive ability adequate to safely complete daily activities?: Yes Is the patient deaf or have difficulty hearing?: No Does the patient have difficulty seeing, even when wearing glasses/contacts?: No Does the patient have difficulty concentrating, remembering, or making decisions?: No Patient able to express need for assistance with ADLs?: Yes Does the patient have difficulty dressing or bathing?: No Independently performs ADLs?: Yes (appropriate for developmental age) Does the patient have difficulty walking or climbing stairs?: No Weakness of Legs: None Weakness of Arms/Hands: None  Permission Sought/Granted                  Emotional Assessment Appearance:: Appears stated age Attitude/Demeanor/Rapport: Engaged Affect (typically observed): Accepting Orientation: : Oriented to Place, Oriented to  Time, Oriented to Situation, Oriented to Self   Psych Involvement: No (comment)  Admission diagnosis:  Hip fracture ([place]) [S72.009A] Pre-op exam [Z01.818] Closed nondisplaced intertrochanteric fracture of left femur, initial encounter ([place]) [S72.145A] Closed left hip fracture, initial encounter [facility]) [S72.002A] Patient Active Problem List   Diagnosis Date Noted  . Closed left hip fracture, initial encounter ([place]) 01/22/2019  . HLD (hyperlipidemia) 01/22/2019  . Depression 01/22/2019  . H/O gastric bypass 04/11/2018  . Colon cancer ([place]) 04/11/2018  . Migraine 04/11/2018  . H/O anorexia nervosa - per review of notes from previous [facility] 05/18/2012  . Osteoporosis, followed by rheum, on reclast 05/18/2012  . CHEST PAIN, ATYPICAL, HX OF 10/02/2007  . HYPERLIPIDEMIA 07/30/2007  . ANXIETY, Followed by Dr. Ysidro Evert 07/30/2007  .  DEPRESSION 07/30/2007  . HYPERTENSION 07/30/2007  . URI 07/30/2007   PCP:  Patient, No Pcp Per Pharmacy:   [place], [address] [place] [address] Phone: [phone] Fax: [phone]     Social Determinants of Health (SDOH) Interventions    Readmission Risk Interventions No flowsheet data found.

## 2019-01-24 NOTE — Progress Notes (Signed)
Physical Therapy Treatment Patient Details Name: Misty Howard MRN: [id number] DOB: January 06, 1956 Today's Date: 01/24/2019    History of Present Illness Pt s/p fall with L hip fx and IM nailing repair.  Pt with hx of PTSD, gastric bypass and Bipolar    PT Comments    Pt with improved pain control this pm allowing increased activity tolerance but with increased time required for all tasks.   Follow Up Recommendations  Home health PT     Equipment Recommendations  Rolling walker with 5" wheels    Recommendations for Other Services OT consult     Precautions / Restrictions Precautions Precautions: Fall Restrictions Weight Bearing Restrictions: No Other Position/Activity Restrictions: WBAT    Mobility  Bed Mobility Overal bed mobility: Needs Assistance Bed Mobility: Sit to Supine     Supine to sit: Min assist Sit to supine: Min assist   General bed mobility comments: Increased time and cues for step-by-step.  Transfers Overall transfer level: Needs assistance Equipment used: Rolling walker (2 wheeled) Transfers: Sit to/from Stand Sit to Stand: Min assist         General transfer comment: Min assist to power up to standing  Ambulation/Gait Ambulation/Gait assistance: Min assist;Min guard Gait Distance (Feet): 38 Feet Assistive device: Rolling walker (2 wheeled) Gait Pattern/deviations: Step-to pattern;Step-through pattern;Decreased step length - right;Decreased step length - left;Shuffle;Trunk flexed Gait velocity: decr   General Gait Details: Increased time with cues for posture, position from RW and sequence.     Stairs             Wheelchair Mobility    Modified Rankin (Stroke Patients Only)       Balance Overall balance assessment: Needs assistance Sitting-balance support: No upper extremity supported;Feet supported Sitting balance-Leahy Scale: Good     Standing balance support: Bilateral upper extremity supported Standing balance-Leahy  Scale: Poor                              Cognition Arousal/Alertness: Awake/alert Behavior During Therapy: WFL for tasks assessed/performed Overall Cognitive Status: Within Functional Limits for tasks assessed                                        Exercises      General Comments General comments (skin integrity, edema, etc.): Pt frustrated by pain      Pertinent Vitals/Pain Pain Assessment: 0-10 Pain Score: 8  Pain Location: L hip Pain Descriptors / Indicators: Aching;Burning;Sore;Spasm Pain Intervention(s): Limited activity within patient's tolerance;Monitored during session;Premedicated before session;Repositioned    Home Living Family/patient expects to be discharged to:: Private residence Living Arrangements: Spouse/significant other Available Help at Discharge: Family Type of Home: House Home Access: Stairs to enter Entrance Stairs-Rails: None Home Layout: Multi-level Home Equipment: None      Prior Function Level of Independence: Independent      Comments: Pt admits to hx of falls   PT Goals (current goals can now be found in the care plan section) Acute Rehab PT Goals Patient Stated Goal: Regain IND and not fall anymore PT Goal Formulation: With patient Time For Goal Achievement: 01/30/19 Potential to Achieve Goals: Good Progress towards PT goals: Progressing toward goals    Frequency    7X/week      PT Plan Current plan remains appropriate    Co-evaluation  AM-PAC PT "6 Clicks" Mobility   Outcome Measure  Help needed turning from your back to your side while in a flat bed without using bedrails?: A Little Help needed moving from lying on your back to sitting on the side of a flat bed without using bedrails?: A Little Help needed moving to and from a bed to a chair (including a wheelchair)?: A Little Help needed standing up from a chair using your arms (e.g., wheelchair or bedside chair)?: A  Little Help needed to walk in hospital room?: A Little Help needed climbing 3-5 steps with a railing? : A Lot 6 Click Score: 17    End of Session Equipment Utilized During Treatment: Gait belt Activity Tolerance: Patient limited by pain;Patient limited by fatigue Patient left: with call bell/phone within reach;in bed;with bed alarm set Nurse Communication: Mobility status PT Visit Diagnosis: Difficulty in walking, not elsewhere classified (R26.2)     Time: 1533-1610 PT Time Calculation (min) (ACUTE ONLY): 37 min  Charges:  $Gait Training: 23-37 mins $Therapeutic Activity: 8-22 mins                     [place] Pager [phone] Office [phone]    Cameren Odwyer 01/24/2019, 4:24 PM

## 2019-01-24 NOTE — Progress Notes (Signed)
Physical Therapy Treatment Patient Details Name: Misty Howard MRN: [id number] DOB: [birth year] Today's Date: 01/24/2019    History of Present Illness Pt s/p fall with L hip fx and IM nailing repair.  Pt with hx of PTSD, gastric bypass and Bipolar    PT Comments    Pt continues to require increased time for all tasks and ltd by pain with mobility.   Follow Up Recommendations  Home health PT     Equipment Recommendations  Rolling walker with 5" wheels    Recommendations for Other Services OT consult     Precautions / Restrictions Precautions Precautions: Fall Restrictions Weight Bearing Restrictions: No Other Position/Activity Restrictions: WBAT    Mobility  Bed Mobility Overal bed mobility: Needs Assistance Bed Mobility: Sit to Supine     Supine to sit: Min assist;Mod assist Sit to supine: Min assist;Mod assist   General bed mobility comments: Increased time with cues for sequence and use of R LE to self assist  Transfers Overall transfer level: Needs assistance Equipment used: Rolling walker (2 wheeled) Transfers: Sit to/from Stand Sit to Stand: Min assist;Mod assist         General transfer comment: cues for LE management and use of UEs to self assist  Ambulation/Gait Ambulation/Gait assistance: Min assist Gait Distance (Feet): 5 Feet Assistive device: Rolling walker (2 wheeled) Gait Pattern/deviations: Step-to pattern;Step-through pattern;Decreased step length - right;Decreased step length - left;Shuffle;Trunk flexed Gait velocity: decr   General Gait Details: Increased time with cues for posture, position from RW and sequence.  DIstance ltd by pain   Stairs             Wheelchair Mobility    Modified Rankin (Stroke Patients Only)       Balance Overall balance assessment: Needs assistance Sitting-balance support: No upper extremity supported;Feet supported Sitting balance-Leahy Scale: Good     Standing balance support: Bilateral upper  extremity supported Standing balance-Leahy Scale: Poor                              Cognition Arousal/Alertness: Awake/alert Behavior During Therapy: WFL for tasks assessed/performed Overall Cognitive Status: Within Functional Limits for tasks assessed                                        Exercises General Exercises - Lower Extremity Ankle Circles/Pumps: AROM;Both;15 reps;Supine Quad Sets: AROM;Both;10 reps;Supine Heel Slides: AAROM;Left;20 reps;Supine Hip ABduction/ADduction: AAROM;Left;15 reps;Supine    General Comments        Pertinent Vitals/Pain Pain Assessment: 0-10 Pain Score: 8  Pain Location: L hip Pain Descriptors / Indicators: Aching;Burning;Grimacing;Guarding;Sore;Throbbing;Spasm Pain Intervention(s): Limited activity within patient's tolerance;Monitored during session;Premedicated before session;Ice applied    Home Living                      Prior Function            PT Goals (current goals can now be found in the care plan section) Acute Rehab PT Goals Patient Stated Goal: Regain IND and not fall anymore PT Goal Formulation: With patient Time For Goal Achievement: 01/30/19 Potential to Achieve Goals: Good Progress towards PT goals: Not progressing toward goals - comment(pain limited)    Frequency    7X/week      PT Plan Current plan remains appropriate    Co-evaluation  AM-PAC PT "6 Clicks" Mobility   Outcome Measure  Help needed turning from your back to your side while in a flat bed without using bedrails?: A Lot Help needed moving from lying on your back to sitting on the side of a flat bed without using bedrails?: A Lot Help needed moving to and from a bed to a chair (including a wheelchair)?: A Lot Help needed standing up from a chair using your arms (e.g., wheelchair or bedside chair)?: A Lot Help needed to walk in hospital room?: A Lot Help needed climbing 3-5 steps with a  railing? : A Lot 6 Click Score: 12    End of Session Equipment Utilized During Treatment: Gait belt Activity Tolerance: Patient limited by pain Patient left: with call bell/phone within reach;in bed;with bed alarm set Nurse Communication: Mobility status PT Visit Diagnosis: Difficulty in walking, not elsewhere classified (R26.2)     Time: 1021-1173 PT Time Calculation (min) (ACUTE ONLY): 29 min  Charges:  $Gait Training: 8-22 mins $Therapeutic Exercise: 8-22 mins $Therapeutic Activity: 8-22 mins                     [place] Pager [phone] Office [phone]    Ami Mally 01/24/2019, 1:28 PM

## 2019-01-24 NOTE — Progress Notes (Signed)
PROGRESS NOTE    Misty Howard  DVV:[id number] DOB: 1956/05/28 DOA: 01/21/2019 PCP: Patient, No Pcp Per   Brief Narrative:  Misty Howard is a 63 y.o. female with history of PTSD, anxiety, gastric bypass on B12 supplements, migraine on Topamax had a fall at home when patient was walking down the stairs.  Patient states she slipped and missed the last days and fell onto the floor.  Denies hitting her head or losing consciousness denies any chest pain or shortness of breath.  She had a left side and was brought to the ER.  ED Course: X-rays in the ER showed left intertrochanteric fracture of the hip.  On-call orthopedic surgeon Dr. Lyla Glassing was consulted.  Plan is to have surgery later today.  Chest x-ray was unremarkable EKG shows normal sinus rhythm.  Labs show hemoglobin of 11.4 sodium 134 otherwise unremarkable.  Patient admitted for hip fracture.  COVID-19 was negative.   Assessment & Plan:   Principal Problem:   Closed left hip fracture, initial encounter ([place]) Active Problems:   HLD (hyperlipidemia)   Depression   1. Closed left hip fracture status post mechanical fall  1. Now s/p IM fixation of L femur 2. WBAT 3. Lovenox in house and d/c on ASA 81 mg PO BID for DVT ppx 4. Pain control - initially planned for d/c 7/4, but pt had worsening pain.  Will change to oxycodone and schedule APAP, continue to monitor.  (rx sent for Norco canceled, spoke to [facility] today, 7/5) 5. PT - pt with slow progress with PT, continue inpatient management for another day 6. L knee pain, negative plain films, continue to monitor  2. History of PTSD on fluoxetine and Abilify.  Take lorazepam for sleep. 3. History of migraine on Topamax 200 mg at bedtime. 4. History of hyperlipidemia. 5. Normocytic normochromic anemia appears to be chronic.  Patient's takes B12 supplements after gastric bypass in 2010.  DVT prophylaxis: lovenox Code Status: full  Family Communication: none at bedside  Disposition Plan: pending   Consultants:   ortho  Procedures:  IM fixation of L femur  Antimicrobials:  Anti-infectives (From admission, onward)   Start     Dose/Rate Route Frequency Ordered Stop   01/22/19 1500  ceFAZolin (ANCEF) IVPB 2g/100 mL premix     2 g 200 mL/hr over 30 Minutes Intravenous Every 6 hours 01/22/19 1154 01/22/19 2200   01/22/19 1200  ceFAZolin (ANCEF) IVPB 2g/100 mL premix  Status:  Discontinued     2 g 200 mL/hr over 30 Minutes Intravenous On call to O.R. 01/22/19 1151 01/22/19 1202   01/22/19 0710  ceFAZolin (ANCEF) 2-4 GM/100ML-% IVPB    Note to Pharmacy: Jefm Miles   : cabinet override      01/22/19 0710 01/22/19 1914     Subjective: C/o L knee pain Otherwise, doing ok Oxycodone is better for pain  Objective: Vitals:   01/23/19 0942 01/23/19 1330 01/23/19 2043 01/24/19 0512  BP: 104/69 118/68 105/66 122/65  Pulse: (!) 105 (!) 102 99 (!) 105  Resp: 16 17 18 16   Temp: 98.6 F (37 C) 98.2 F (36.8 C) 98.7 F (37.1 C) 98 F (36.7 C)  TempSrc: Oral Oral Oral   SpO2: 97% 90% 100% 98%  Weight:      Height:        Intake/Output Summary (Last 24 hours) at 01/24/2019 1326 Last data filed at 01/24/2019 0700 Gross per 24 hour  Intake 480 ml  Output  575 ml  Net -95 ml   Filed Weights   01/21/19 2237  Weight: 57.6 kg    Examination:  General: No acute distress. Cardiovascular: Heart sounds show a regular rate, and rhythm. Lungs: Clear to auscultation bilaterally  Abdomen: Soft, nontender, nondistended  Neurological: Alert and oriented 3. Moves all extremities 4. Cranial nerves II through XII grossly intact. Skin: Warm and dry. No rashes or lesions. Extremities: LLE with intact dressing, knee without notable swelling, mild ttp    Data Reviewed: I have personally reviewed following labs and imaging studies  CBC: Recent Labs  Lab 01/21/19 2331 01/22/19 1025 01/23/19 0251 01/24/19 0317  WBC 7.7 10.4 7.3 8.6  NEUTROABS 6.4  --    --   --   HGB 11.4* 11.2* 10.9* 10.2*  HCT 36.3 35.3* 34.9* 32.6*  MCV 97.1 98.1 99.4 99.7  PLT 271 255 242 494   Basic Metabolic Panel: Recent Labs  Lab 01/21/19 2331 01/22/19 1025 01/23/19 0251 01/24/19 0317  NA 134* 131* 138 137  K 3.7 4.2 3.9 3.2*  CL 104 101 106 107  CO2 22 21* 24 25  GLUCOSE 115* 111* 121* 114*  BUN 10 8 9 12   CREATININE 0.71 0.74 0.67 0.56  CALCIUM 7.9* 8.1* 8.6* 8.0*  MG  --   --  2.2 2.1   GFR: Estimated Creatinine Clearance: 66.5 mL/min (by C-G formula based on SCr of 0.56 mg/dL). Liver Function Tests: Recent Labs  Lab 01/23/19 0251 01/24/19 0317  AST 24 21  ALT 17 14  ALKPHOS 68 61  BILITOT 0.4 0.3  PROT 6.3* 6.1*  ALBUMIN 3.6 3.3*   No results for input(s): LIPASE, AMYLASE in the last 168 hours. No results for input(s): AMMONIA in the last 168 hours. Coagulation Profile: No results for input(s): INR, PROTIME in the last 168 hours. Cardiac Enzymes: No results for input(s): CKTOTAL, CKMB, CKMBINDEX, TROPONINI in the last 168 hours. BNP (last 3 results) No results for input(s): PROBNP in the last 8760 hours. HbA1C: No results for input(s): HGBA1C in the last 72 hours. CBG: Recent Labs  Lab 01/23/19 0132 01/23/19 0759 01/23/19 1602 01/23/19 2256  GLUCAP 100* 117* 112* 120*   Lipid Profile: No results for input(s): CHOL, HDL, LDLCALC, TRIG, CHOLHDL, LDLDIRECT in the last 72 hours. Thyroid Function Tests: No results for input(s): TSH, T4TOTAL, FREET4, T3FREE, THYROIDAB in the last 72 hours. Anemia Panel: No results for input(s): VITAMINB12, FOLATE, FERRITIN, TIBC, IRON, RETICCTPCT in the last 72 hours. Sepsis Labs: No results for input(s): PROCALCITON, LATICACIDVEN in the last 168 hours.  Recent Results (from the past 240 hour(s))  SARS Coronavirus 2 (CEPHEID - Performed in [place] hospital lab), Hosp Order     Status: None   Collection Time: 01/21/19 11:38 PM   Specimen: Nasopharyngeal Swab  Result Value Ref Range Status    SARS Coronavirus 2 NEGATIVE NEGATIVE Final    Comment: (NOTE) If result is NEGATIVE SARS-CoV-2 target nucleic acids are NOT DETECTED. The SARS-CoV-2 RNA is generally detectable in upper and lower  respiratory specimens during the acute phase of infection. The lowest  concentration of SARS-CoV-2 viral copies this assay can detect is 250  copies / mL. A negative result does not preclude SARS-CoV-2 infection  and should not be used as the sole basis for treatment or other  patient management decisions.  A negative result may occur with  improper specimen collection / handling, submission of specimen other  than nasopharyngeal swab, presence of viral mutation(s)  within the  areas targeted by this assay, and inadequate number of viral copies  (<250 copies / mL). A negative result must be combined with clinical  observations, patient history, and epidemiological information. If result is POSITIVE SARS-CoV-2 target nucleic acids are DETECTED. The SARS-CoV-2 RNA is generally detectable in upper and lower  respiratory specimens dur ing the acute phase of infection.  Positive  results are indicative of active infection with SARS-CoV-2.  Clinical  correlation with patient history and other diagnostic information is  necessary to determine patient infection status.  Positive results do  not rule out bacterial infection or co-infection with other viruses. If result is PRESUMPTIVE POSTIVE SARS-CoV-2 nucleic acids MAY BE PRESENT.   A presumptive positive result was obtained on the submitted specimen  and confirmed on repeat testing.  While 2019 novel coronavirus  (SARS-CoV-2) nucleic acids may be present in the submitted sample  additional confirmatory testing may be necessary for epidemiological  and / or clinical management purposes  to differentiate between  SARS-CoV-2 and other Sarbecovirus currently known to infect humans.  If clinically indicated additional testing with an alternate test   methodology [phone]) is advised. The SARS-CoV-2 RNA is generally  detectable in upper and lower respiratory sp ecimens during the acute  phase of infection. The expected result is Negative. Fact Sheet for Patients:  StrictlyIdeas.no Fact Sheet for Healthcare Providers: BankingDealers.co.za This test is not yet approved or cleared by the [place] FDA and has been authorized for detection and/or diagnosis of SARS-CoV-2 by FDA under an Emergency Use Authorization (EUA).  This EUA will remain in effect (meaning this test can be used) for the duration of the COVID-19 declaration under Section 564(b)(1) of the Act, 21 U.S.C. section 360bbb-3(b)(1), unless the authorization is terminated or revoked sooner. Performed at [facility], [place] [address], [place], [place] [address]          Radiology Studies: No results found.      Scheduled Meds: . acetaminophen  1,000 mg Oral Q8H  . ARIPiprazole  2 mg Oral QHS  . chlorhexidine  60 mL Topical Once  . docusate sodium  100 mg Oral BID  . enoxaparin (LOVENOX) injection  40 mg Subcutaneous Q24H  . feeding supplement  237 mL Oral BID BM  . FLUoxetine  60 mg Oral QHS  . LORazepam  2.5 mg Oral QHS  . pantoprazole  40 mg Oral QHS  . potassium chloride  40 mEq Oral Q4H  . povidone-iodine  2 application Topical Once  . senna  1 tablet Oral BID  . topiramate  200 mg Oral QHS   Continuous Infusions:    LOS: 2 days    Time spent: over 30 min    Fayrene Helper, MD [facility] Pager AMION  If 7PM-7AM, please contact night-coverage [email or web address] Password [email or web address] 01/24/2019, 1:26 PM

## 2019-01-24 NOTE — Progress Notes (Signed)
Physical Therapy Treatment Patient Details Name: Misty Howard MRN: [id number] DOB: [birth year] Today's Date: 01/24/2019    History of Present Illness Pt s/p fall with L hip fx and IM nailing repair.  Pt with hx of PTSD, gastric bypass and Bipolar    PT Comments    Pt continues cooperative but requiring increased time for all tasks an pain ltd with mobility.   Follow Up Recommendations  Home health PT     Equipment Recommendations  Rolling walker with 5" wheels    Recommendations for Other Services OT consult     Precautions / Restrictions Precautions Precautions: Fall Restrictions Weight Bearing Restrictions: No Other Position/Activity Restrictions: WBAT    Mobility  Bed Mobility Overal bed mobility: Needs Assistance Bed Mobility: Supine to Sit     Supine to sit: Min assist;Mod assist     General bed mobility comments: Increased time with cues for sequence and use of R LE to self assist  Transfers Overall transfer level: Needs assistance Equipment used: Rolling walker (2 wheeled) Transfers: Sit to/from Stand Sit to Stand: Min assist;Mod assist         General transfer comment: cues for LE management and use of UEs to self assist  Ambulation/Gait Ambulation/Gait assistance: Min assist Gait Distance (Feet): 5 Feet Assistive device: Rolling walker (2 wheeled) Gait Pattern/deviations: Step-to pattern;Step-through pattern;Decreased step length - right;Decreased step length - left;Shuffle;Trunk flexed Gait velocity: decr   General Gait Details: Increased time with cues for posture, position from RW and sequence.  DIstance ltd by pain   Stairs             Wheelchair Mobility    Modified Rankin (Stroke Patients Only)       Balance Overall balance assessment: Needs assistance Sitting-balance support: No upper extremity supported;Feet supported Sitting balance-Leahy Scale: Good     Standing balance support: Bilateral upper extremity  supported Standing balance-Leahy Scale: Poor                              Cognition Arousal/Alertness: Awake/alert Behavior During Therapy: WFL for tasks assessed/performed Overall Cognitive Status: Within Functional Limits for tasks assessed                                        Exercises General Exercises - Lower Extremity Ankle Circles/Pumps: AROM;Both;15 reps;Supine Quad Sets: AROM;Both;10 reps;Supine Heel Slides: AAROM;Left;20 reps;Supine Hip ABduction/ADduction: AAROM;Left;15 reps;Supine    General Comments        Pertinent Vitals/Pain Pain Assessment: 0-10 Pain Score: 8  Pain Location: L hip Pain Descriptors / Indicators: Aching;Burning;Grimacing;Guarding;Sore;Throbbing;Spasm Pain Intervention(s): Limited activity within patient's tolerance;Monitored during session;Premedicated before session;Ice applied    Home Living                      Prior Function            PT Goals (current goals can now be found in the care plan section) Acute Rehab PT Goals Patient Stated Goal: Regain IND and not fall anymore PT Goal Formulation: With patient Time For Goal Achievement: 01/30/19 Potential to Achieve Goals: Good Progress towards PT goals: Not progressing toward goals - comment(pain ltd)    Frequency    7X/week      PT Plan Current plan remains appropriate    Co-evaluation  AM-PAC PT "6 Clicks" Mobility   Outcome Measure  Help needed turning from your back to your side while in a flat bed without using bedrails?: A Lot Help needed moving from lying on your back to sitting on the side of a flat bed without using bedrails?: A Lot Help needed moving to and from a bed to a chair (including a wheelchair)?: A Lot Help needed standing up from a chair using your arms (e.g., wheelchair or bedside chair)?: A Lot Help needed to walk in hospital room?: A Lot Help needed climbing 3-5 steps with a railing? : A  Lot 6 Click Score: 12    End of Session Equipment Utilized During Treatment: Gait belt Activity Tolerance: Patient limited by pain Patient left: in chair;with call bell/phone within reach;with chair alarm set Nurse Communication: Mobility status PT Visit Diagnosis: Difficulty in walking, not elsewhere classified (R26.2)     Time: 9244-6286 PT Time Calculation (min) (ACUTE ONLY): 33 min  Charges:  $Gait Training: 8-22 mins $Therapeutic Exercise: 8-22 mins                     [place] Pager [phone] Office [phone]    Irene Mitcham 01/24/2019, 1:22 PM

## 2019-01-24 NOTE — Progress Notes (Signed)
   Subjective: 2 Days Post-Op Procedure(s) (LRB): INTRAMEDULLARY (IM) NAIL INTERTROCHANTRIC (Left) Patient reports pain as severe.   Patient seen in rounds for Dr. Lyla Glassing. Patient is well, and has had no acute complaints or problems other than pain in the left hip and knee. No acute events overnight. Foley catheter removed. Denies CP, SHOB, calf pain.  We will continue therapy today.   Objective: Vital signs in last 24 hours: Temp:  [98 F (36.7 C)-98.7 F (37.1 C)] 98 F (36.7 C) (07/05 0512) Pulse Rate:  [99-105] 105 (07/05 0512) Resp:  [16-18] 16 (07/05 0512) BP: (105-122)/(65-68) 122/65 (07/05 0512) SpO2:  [90 %-100 %] 98 % (07/05 0512)  Intake/Output from previous day:  Intake/Output Summary (Last 24 hours) at 01/24/2019 1136 Last data filed at 01/24/2019 0700 Gross per 24 hour  Intake 480 ml  Output 575 ml  Net -95 ml     Intake/Output this shift: No intake/output data recorded.  Labs: Recent Labs    01/21/19 2331 01/22/19 1025 01/23/19 0251 01/24/19 0317  HGB 11.4* 11.2* 10.9* 10.2*   Recent Labs    01/23/19 0251 01/24/19 0317  WBC 7.3 8.6  RBC 3.51* 3.27*  HCT 34.9* 32.6*  PLT 242 217   Recent Labs    01/23/19 0251 01/24/19 0317  NA 138 137  K 3.9 3.2*  CL 106 107  CO2 24 25  BUN 9 12  CREATININE 0.67 0.56  GLUCOSE 121* 114*  CALCIUM 8.6* 8.0*   No results for input(s): LABPT, INR in the last 72 hours.  Exam: General - Patient is Alert and Oriented Extremity - Neurologically intact Sensation intact distally Intact pulses distally Dorsiflexion/Plantar flexion intact Dressing - dressing C/D/I Motor Function - intact, moving foot and toes well on exam.   Past Medical History:  Diagnosis Date  . Anxiety   . Bipolar disorder ([place])   . Cancer ([place])   . Depression   . Esophageal ring    s/p dilation 2003  . Headache(784.0)    frequent  . Hepatic steatosis   . Hyperlipidemia   . LAE (left atrial enlargement)    per records on echo  in 2008  . Migraine   . Protein calorie malnutrition ([facility])     Assessment/Plan: 2 Days Post-Op Procedure(s) (LRB): INTRAMEDULLARY (IM) NAIL INTERTROCHANTRIC (Left) Principal Problem:   Closed left hip fracture, initial encounter ([facility]) Active Problems:   HLD (hyperlipidemia)   Depression  Estimated body mass index is 21.13 kg/m as calculated from the following:   Height as of this encounter: 5\' 5"  (1.651 m).   Weight as of this encounter: 57.6 kg. Advance diet Up with therapy  DVT Prophylaxis - Lovenox with transition to 81 mg ASA BID upon discharge x6 weeks Weight bearing as tolerated. D/C O2 and pulse ox and try on room air.  Plan is to go Home after hospital stay with her daughter. Patient has progressed slowly with therapy secondary to pain. We will continue pre-medicating prior to PT sessions with a goal of progressing towards safe discharge home. She will continue working with therapy today. Patient complains of left knee pain, which we discussed may be related to positioning during surgery and should continue to improve. If her pain worsens, she will notify us. If not, we will follow up on this in the office. Plan for follow up in the office with Dr. Lyla Glassing in 2 weeks.  Griffith Citron, PA-C Orthopedic Surgery 01/24/2019, 11:36 AM

## 2019-01-24 NOTE — Evaluation (Signed)
Occupational Therapy Evaluation Patient Details Name: Misty Howard MRN: [id number] DOB: 07/01/1956 Today's Date: 01/24/2019    History of Present Illness Pt s/p fall with L hip fx and IM nailing repair.  Pt with hx of PTSD, gastric bypass and Bipolar   Clinical Impression   PTA, pt was independent with ADL and functional mobility. She currently is limited by severe pain and guarding throughout mobility. She is very anxious when moving. She is requiring total assist for LB ADL and min assist for toilet transfers. She was able to complete toileting hygiene as well as grooming tasks today with guarding assist while standing. Pt would benefit from continued OT services while admitted to improve independence and safety with ADL and functional mobility. Recommend 24 hour assistance upon discharge for safety. OT will continue to follow while admitted.     Follow Up Recommendations  Supervision/Assistance - 24 hour    Equipment Recommendations  3 in 1 bedside commode    Recommendations for Other Services       Precautions / Restrictions Precautions Precautions: Fall Restrictions Weight Bearing Restrictions: No Other Position/Activity Restrictions: WBAT      Mobility Bed Mobility Overal bed mobility: Needs Assistance Bed Mobility: Sit to Supine;Supine to Sit     Supine to sit: Min assist Sit to supine: Mod assist   General bed mobility comments: Increased time and cues for step-by-step.  Transfers Overall transfer level: Needs assistance Equipment used: Rolling walker (2 wheeled) Transfers: Sit to/from Stand Sit to Stand: Min assist;From elevated surface         General transfer comment: Min assist to power up to standing    Balance Overall balance assessment: Needs assistance Sitting-balance support: No upper extremity supported;Feet supported Sitting balance-Leahy Scale: Good     Standing balance support: Bilateral upper extremity supported Standing balance-Leahy  Scale: Poor                             ADL either performed or assessed with clinical judgement   ADL Overall ADL's : Needs assistance/impaired Eating/Feeding: Set up;Sitting   Grooming: Min guard;Standing;Wash/dry hands   Upper Body Bathing: Set up;Sitting   Lower Body Bathing: Total assistance;Sit to/from stand   Upper Body Dressing : Set up;Sitting   Lower Body Dressing: Total assistance;Sit to/from stand   Toilet Transfer: Minimal assistance;Ambulation;RW Toilet Transfer Details (indicate cue type and reason): simulated in room from elevated surface; very slow mobility Toileting- Clothing Manipulation and Hygiene: Minimal assistance;Sit to/from stand Toileting - Clothing Manipulation Details (indicate cue type and reason): assist for balance primarily     Functional mobility during ADLs: Minimal assistance;Rolling walker General ADL Comments: Pt moving very slowly and on her own terms     Vision Patient Visual Report: No change from baseline Vision Assessment?: No apparent visual deficits     Perception     Praxis      Pertinent Vitals/Pain Pain Assessment: 0-10 Pain Score: 7  Pain Location: L hip Pain Descriptors / Indicators: Aching;Burning;Grimacing;Guarding;Sore;Throbbing;Spasm Pain Intervention(s): Limited activity within patient's tolerance;Monitored during session;Premedicated before session;Repositioned     Hand Dominance     Extremity/Trunk Assessment Upper Extremity Assessment Upper Extremity Assessment: Overall WFL for tasks assessed   Lower Extremity Assessment Lower Extremity Assessment: LLE deficits/detail LLE Deficits / Details: Post-operative deficits as anticipated.    Cervical / Trunk Assessment Cervical / Trunk Assessment: Normal   Communication Communication Communication: No difficulties   Cognition Arousal/Alertness: Awake/alert Behavior  During Therapy: WFL for tasks assessed/performed Overall Cognitive Status:  Within Functional Limits for tasks assessed                                     General Comments  Pt frustrated by pain    Exercises     Shoulder Instructions      Home Living Family/patient expects to be discharged to:: Private residence Living Arrangements: Spouse/significant other Available Help at Discharge: Family Type of Home: House Home Access: Stairs to enter Technical brewer of Steps: 3+3 Entrance Stairs-Rails: None Home Layout: Multi-level Alternate Level Stairs-Number of Steps: 6 Alternate Level Stairs-Rails: Right           Home Equipment: None          Prior Functioning/Environment Level of Independence: Independent        Comments: Pt admits to hx of falls        OT Problem List: Decreased strength;Decreased range of motion;Decreased activity tolerance;Impaired balance (sitting and/or standing);Decreased safety awareness;Decreased knowledge of use of DME or AE;Decreased knowledge of precautions;Pain      OT Treatment/Interventions: Self-care/ADL training;Therapeutic exercise;Energy conservation;DME and/or AE instruction;Therapeutic activities;Patient/family education;Balance training    OT Goals(Current goals can be found in the care plan section) Acute Rehab OT Goals Patient Stated Goal: Regain IND and not fall anymore OT Goal Formulation: With patient Time For Goal Achievement: 02/07/19 Potential to Achieve Goals: Good ADL Goals Pt Will Perform Grooming: with modified independence;standing Pt Will Perform Lower Body Dressing: with modified independence;sit to/from stand Pt Will Transfer to Toilet: with modified independence;ambulating;bedside commode Pt Will Perform Toileting - Clothing Manipulation and hygiene: with modified independence;sit to/from stand Pt Will Perform Tub/Shower Transfer: with min assist;ambulating;rolling walker;3 in 1  OT Frequency: Min 2X/week   Barriers to D/C:            Co-evaluation               AM-PAC OT "6 Clicks" Daily Activity     Outcome Measure Help from another person eating meals?: None Help from another person taking care of personal grooming?: None Help from another person toileting, which includes using toliet, bedpan, or urinal?: A Little Help from another person bathing (including washing, rinsing, drying)?: A Lot Help from another person to put on and taking off regular upper body clothing?: None Help from another person to put on and taking off regular lower body clothing?: A Lot 6 Click Score: 19   End of Session Equipment Utilized During Treatment: Rolling walker;Gait belt Nurse Communication: Mobility status  Activity Tolerance: Patient tolerated treatment well Patient left: in bed;with call bell/phone within reach;with bed alarm set(declined to stay in recliner)  OT Visit Diagnosis: Other abnormalities of gait and mobility (R26.89);Pain Pain - Right/Left: Left Pain - part of body: Hip                Time: 1215-1309 OT Time Calculation (min): 54 min Charges:  OT General Charges $OT Visit: 1 Visit OT Evaluation $OT Eval Moderate Complexity: 1 Mod OT Treatments $Self Care/Home Management : 38-52 mins  [place] A Jewels Langone 01/24/2019, 4:05 PM

## 2019-01-25 ENCOUNTER — Encounter (HOSPITAL_COMMUNITY): Payer: Self-pay | Admitting: Orthopedic Surgery

## 2019-01-25 LAB — COMPREHENSIVE METABOLIC PANEL
ALT: 12 U/L (ref 0–44)
AST: 15 U/L (ref 15–41)
Albumin: 3.1 g/dL — ABNORMAL LOW (ref 3.5–5.0)
Alkaline Phosphatase: 58 U/L (ref 38–126)
Anion gap: 5 (ref 5–15)
BUN: 12 mg/dL (ref 8–23)
CO2: 24 mmol/L (ref 22–32)
Calcium: 8.3 mg/dL — ABNORMAL LOW (ref 8.9–10.3)
Chloride: 107 mmol/L (ref 98–111)
Creatinine, Ser: 0.62 mg/dL (ref 0.44–1.00)
GFR calc Af Amer: >60 mL/min (ref >60)
GFR calc non Af Amer: >60 mL/min (ref >60)
Glucose, Bld: 121 mg/dL — ABNORMAL HIGH (ref 70–99)
Potassium: 3.9 mmol/L (ref 3.5–5.1)
Sodium: 136 mmol/L (ref 135–145)
Total Bilirubin: 0.6 mg/dL (ref 0.3–1.2)
Total Protein: 6.2 g/dL — ABNORMAL LOW (ref 6.5–8.1)

## 2019-01-25 LAB — CBC
HCT: 30.6 % — ABNORMAL LOW (ref 36.0–46.0)
Hemoglobin: 9.3 g/dL — ABNORMAL LOW (ref 12.0–15.0)
MCH: 30.3 pg (ref 26.0–34.0)
MCHC: 30.4 g/dL (ref 30.0–36.0)
MCV: 99.7 fL (ref 80.0–100.0)
Platelets: 220 10*3/uL (ref 150–400)
RBC: 3.07 MIL/uL — ABNORMAL LOW (ref 3.87–5.11)
RDW: 13.3 % (ref 11.5–15.5)
WBC: 7.2 10*3/uL (ref 4.0–10.5)
nRBC: 0 % (ref 0.0–0.2)

## 2019-01-25 LAB — MAGNESIUM: Magnesium: 2.1 mg/dL (ref 1.7–2.4)

## 2019-01-25 MED ORDER — PROMETHAZINE HCL 25 MG PO TABS: 25.0000 mg | ORAL_TABLET | Freq: Three times a day (TID) | ORAL | 0 refills | Status: DC | PRN

## 2019-01-25 MED ORDER — OXYCODONE HCL 5 MG PO TABS: 5.0000 mg | ORAL_TABLET | Freq: Four times a day (QID) | ORAL | 0 refills | Status: AC | PRN

## 2019-01-25 NOTE — Progress Notes (Signed)
Physical Therapy Treatment Patient Details Name: Misty Howard MRN: [id number] DOB: 02/14/1956 Today's Date: 01/25/2019    History of Present Illness Pt s/p fall with L hip fx and IM nailing repair.  Pt with hx of PTSD, gastric bypass and Bipolar    PT Comments    POD # 3 pm session Re educated on stairs as pt could not recall from morning session.  Using "Teach Back" educated on proper tech and sequencing.  Assisted out of recliner to amb, pt declined due to MAX c/o fatigue and requested to go back to bed.  Demonstrated and instructed pt how to use belt to self assist LE as a leg lifter Performed some THR TE's following HEP handout.  Instructed on proper tech, freq as well as use of ICE.  Discussed sleeping positions and use of pillows for comfort and pressure relief.  Addressed all mobility questions, discussed appropriate activity, educated on use of ICE.  Pt ready for D/C to home.   Follow Up Recommendations  Home health PT     Equipment Recommendations  Rolling walker with 5" wheels;3in1 (PT)    Recommendations for Other Services       Precautions / Restrictions Precautions Precautions: Fall Restrictions Weight Bearing Restrictions: No Other Position/Activity Restrictions: WBAT    Mobility  Bed Mobility Overal bed mobility: Needs Assistance Bed Mobility: Sit to Supine     Supine to sit: Min assist Sit to supine: Supervision;Min guard   General bed mobility comments: Increased time x 3 and cues for step-by-step.  Transfers Overall transfer level: Needs assistance Equipment used: Rolling walker (2 wheeled) Transfers: Sit to/from [facility] Sit to Stand: Min guard Stand pivot transfers: Min guard;Min assist       General transfer comment: repeat instruction on hand placement and safety with turns  Ambulation/Gait Ambulation/Gait assistance: Min guard;Min assist Gait Distance (Feet): 2 Feet Assistive device: Rolling walker (2 wheeled) Gait  Pattern/deviations: Step-to pattern;Step-through pattern;Decreased step length - right;Decreased step length - left;Shuffle;Trunk flexed;Decreased stance time - right Gait velocity: decreased x 3 (very slow)   General Gait Details: a few steps back to bed   Stairs Stairs: Yes Stairs assistance: Min guard Stair Management: No rails;Step to pattern;Forwards Number of Stairs: 2 General stair comments: while FaceTiming daughter, practiced stairs which required repeat VC's   Wheelchair Mobility    Modified Rankin (Stroke Patients Only)       Balance Overall balance assessment: Needs assistance Sitting-balance support: No upper extremity supported;Feet supported Sitting balance-Leahy Scale: Good     Standing balance support: Bilateral upper extremity supported Standing balance-Leahy Scale: Poor                              Cognition Arousal/Alertness: Awake/alert Behavior During Therapy: WFL for tasks assessed/performed Overall Cognitive Status: Within Functional Limits for tasks assessed                                 General Comments: required repeat instructions.  Slightly distracted and groggy.      Exercises  Surgical  Hip TE's 10 reps ankle pumps 10 reps knee presses 10 reps heel slides 10 reps SAQ's 10 reps ABD Educated on using tsrap to self assist with TE's Followed by ICE     General Comments        Pertinent Vitals/Pain Pain Assessment: 0-10 Pain Score: 5  Pain Location: L hip Pain Descriptors / Indicators: Aching;Sore;Operative site guarding Pain Intervention(s): Monitored during session;Premedicated before session;Repositioned;Ice applied    Home Living                      Prior Function            PT Goals (current goals can now be found in the care plan section) Progress towards PT goals: Progressing toward goals    Frequency    7X/week      PT Plan Current plan remains appropriate     Co-evaluation              AM-PAC PT "6 Clicks" Mobility   Outcome Measure  Help needed turning from your back to your side while in a flat bed without using bedrails?: A Little Help needed moving from lying on your back to sitting on the side of a flat bed without using bedrails?: A Little Help needed moving to and from a bed to a chair (including a wheelchair)?: A Little Help needed standing up from a chair using your arms (e.g., wheelchair or bedside chair)?: A Little Help needed to walk in hospital room?: A Little Help needed climbing 3-5 steps with a railing? : A Lot 6 Click Score: 17    End of Session Equipment Utilized During Treatment: Gait belt Activity Tolerance: Patient limited by pain;Patient limited by fatigue Patient left: in bed Nurse Communication: Mobility status PT Visit Diagnosis: Difficulty in walking, not elsewhere classified (R26.2)     Time: 9563-8756 PT Time Calculation (min) (ACUTE ONLY): 38 min  Charges:  $Gait Training: 8-22 mins $Therapeutic Exercise: 8-22 mins $Therapeutic Activity: 8-22 mins                     {Aneisha Skyles  PTA Acute  Rehabilitation Services Pager      [phone] Office      [phone]

## 2019-01-25 NOTE — Progress Notes (Signed)
Physical Therapy Treatment Patient Details Name: Misty Howard MRN: [id number] DOB: 1955/07/25 Today's Date: 01/25/2019    History of Present Illness Pt s/p fall with L hip fx and IM nailing repair.  Pt with hx of PTSD, gastric bypass and Bipolar    PT Comments    POD # 3 am session Assisted OOB.  Demonstrated and instructed pt how to use belt to self assist LE as a leg lifter  General bed mobility comments: Increased time x 3 and cues for step-by-step.General transfer comment: repeat instruction on hand placement and safety with turns.  General Gait Details: increased time x 3 with difficulty advanceing R LE.  50% VC's on upright posture and proper walker to self distance.General stair comments: while FaceTiming daughter, practiced stairs which required repeat VC's.  Pt will need another PT session to address TE's.     Follow Up Recommendations  Home health PT     Equipment Recommendations  Rolling walker with 5" wheels;3in1 (PT)    Recommendations for Other Services       Precautions / Restrictions Precautions Precautions: Fall Restrictions Weight Bearing Restrictions: No Other Position/Activity Restrictions: WBAT    Mobility  Bed Mobility Overal bed mobility: Needs Assistance Bed Mobility: Supine to Sit     Supine to sit: Min assist     General bed mobility comments: Increased time x 3 and cues for step-by-step.  Transfers Overall transfer level: Needs assistance Equipment used: Rolling walker (2 wheeled) Transfers: Sit to/from [facility] Sit to Stand: Min guard Stand pivot transfers: Min guard;Min assist       General transfer comment: repeat instruction on hand placement and safety with turns  Ambulation/Gait Ambulation/Gait assistance: Min guard;Min assist Gait Distance (Feet): 18 Feet Assistive device: Rolling walker (2 wheeled) Gait Pattern/deviations: Step-to pattern;Step-through pattern;Decreased step length - right;Decreased step  length - left;Shuffle;Trunk flexed;Decreased stance time - right Gait velocity: decreased x 3 (very slow)   General Gait Details: increased time x 3 with difficulty advanceing R LE.  50% VC's on upright posture and proper walker to self distance.   Stairs Stairs: Yes Stairs assistance: Min guard Stair Management: No rails;Step to pattern;Forwards Number of Stairs: 2 General stair comments: while FaceTiming daughter, practiced stairs which required repeat VC's   Wheelchair Mobility    Modified Rankin (Stroke Patients Only)       Balance Overall balance assessment: Needs assistance Sitting-balance support: No upper extremity supported;Feet supported Sitting balance-Leahy Scale: Good     Standing balance support: Bilateral upper extremity supported Standing balance-Leahy Scale: Poor                              Cognition Arousal/Alertness: Awake/alert Behavior During Therapy: WFL for tasks assessed/performed Overall Cognitive Status: Within Functional Limits for tasks assessed                                 General Comments: required repeat instructions.  Slightly distracted and groggy.      Exercises      General Comments        Pertinent Vitals/Pain Pain Assessment: 0-10 Pain Score: 5  Pain Location: L hip Pain Descriptors / Indicators: Aching;Sore;Operative site guarding Pain Intervention(s): Monitored during session;Premedicated before session;Repositioned;Ice applied    Home Living  Prior Function            PT Goals (current goals can now be found in the care plan section) Progress towards PT goals: Progressing toward goals    Frequency    7X/week      PT Plan Current plan remains appropriate    Co-evaluation              AM-PAC PT "6 Clicks" Mobility   Outcome Measure  Help needed turning from your back to your side while in a flat bed without using bedrails?: A Little Help  needed moving from lying on your back to sitting on the side of a flat bed without using bedrails?: A Little Help needed moving to and from a bed to a chair (including a wheelchair)?: A Little Help needed standing up from a chair using your arms (e.g., wheelchair or bedside chair)?: A Little Help needed to walk in hospital room?: A Little Help needed climbing 3-5 steps with a railing? : A Lot 6 Click Score: 17    End of Session Equipment Utilized During Treatment: Gait belt Activity Tolerance: Patient limited by pain;Patient limited by fatigue Patient left: with call bell/phone within reach;with bed alarm set;in chair Nurse Communication: Mobility status PT Visit Diagnosis: Difficulty in walking, not elsewhere classified (R26.2)     Time: 5110-2111 PT Time Calculation (min) (ACUTE ONLY): 26 min  Charges:  $Gait Training: 8-22 mins $Therapeutic Activity: 8-22 mins                     Rica Koyanagi  PTA Acute  Rehabilitation Services Pager      [phone] Office      [phone]

## 2019-01-25 NOTE — Progress Notes (Signed)
Occupational Therapy Treatment Patient Details Name: Misty Howard MRN: [id number] DOB: 02-23-1956 Today's Date: 01/25/2019    History of present illness Pt s/p fall with L hip fx and IM nailing repair.  Pt with hx of PTSD, gastric bypass and Bipolar   OT comments  Pt agreed to OOB with MAX encouragement.  Pt states daughter will be there for all ADL activity.  Pt interested in reacher but not sock aid.    Follow Up Recommendations  Supervision/Assistance - 24 hour;Home health OT    Equipment Recommendations  3 in 1 bedside commode    Recommendations for Other Services      Precautions / Restrictions Precautions Precautions: Fall       Mobility Bed Mobility Overal bed mobility: Needs Assistance Bed Mobility: Sit to Supine     Supine to sit: Min assist     General bed mobility comments: Increased time and cues for step-by-step.  Transfers Overall transfer level: Needs assistance Equipment used: Rolling walker (2 wheeled) Transfers: Sit to/from [facility] Sit to Stand: Min assist Stand pivot transfers: Min assist       General transfer comment: Min assist to power up to standing    Balance Overall balance assessment: Needs assistance Sitting-balance support: No upper extremity supported;Feet supported Sitting balance-Leahy Scale: Good     Standing balance support: Bilateral upper extremity supported Standing balance-Leahy Scale: Poor                             ADL either performed or assessed with clinical judgement   ADL Overall ADL's : Needs assistance/impaired                 Upper Body Dressing : Set up;Sitting   Lower Body Dressing: Moderate assistance;Sit to/from stand;Cueing for compensatory techniques;Cueing for safety;With adaptive equipment Lower Body Dressing Details (indicate cue type and reason): educated in use of reacher. Pt not interested in sock aide stating her daughter do this for her. Toilet  Transfer: Minimal assistance;Ambulation;RW   Toileting- Clothing Manipulation and Hygiene: Minimal assistance;Sit to/from stand;Cueing for sequencing;Cueing for compensatory techniques     Tub/Shower Transfer Details (indicate cue type and reason): veralized safety. do not reccomend pt performing at this time.  Will defer to [facility] Functional mobility during ADLs: Rolling walker;Minimal assistance General ADL Comments: increased time for all activity     Vision Baseline Vision/History: No visual deficits            Cognition Arousal/Alertness: Awake/alert Behavior During Therapy: WFL for tasks assessed/performed Overall Cognitive Status: Within Functional Limits for tasks assessed                                                     Pertinent Vitals/ Pain       Pain Score: 4  Pain Location: L hip Pain Descriptors / Indicators: Aching;Sore Pain Intervention(s): Limited activity within patient's tolerance     Prior Functioning/Environment              Frequency  Min 2X/week        Progress Toward Goals  OT Goals(current goals can now be found in the care plan section)  Progress towards OT goals: Progressing toward goals     Plan Discharge plan remains appropriate  AM-PAC OT "6 Clicks" Daily Activity     Outcome Measure   Help from another person eating meals?: None Help from another person taking care of personal grooming?: None Help from another person toileting, which includes using toliet, bedpan, or urinal?: A Little Help from another person bathing (including washing, rinsing, drying)?: A Lot Help from another person to put on and taking off regular upper body clothing?: None Help from another person to put on and taking off regular lower body clothing?: A Lot 6 Click Score: 19    End of Session Equipment Utilized During Treatment: Rolling walker;Gait belt  OT Visit Diagnosis: Other abnormalities of gait and mobility  (R26.89);Pain Pain - Right/Left: Left Pain - part of body: Hip   Activity Tolerance Patient tolerated treatment well   Patient Left in chair;with call bell/phone within reach(declined to stay in recliner)   Nurse Communication Mobility status        Time: 8333-8329 OT Time Calculation (min): 18 min  Charges: OT General Charges $OT Visit: 1 Visit OT Treatments $Self Care/Home Management : 8-22 mins  Kari Baars, [place] [phone] Office- [place], Edwena Felty D 01/25/2019, 2:28 PM

## 2019-01-29 ENCOUNTER — Ambulatory Visit: Payer: BLUE CROSS/BLUE SHIELD | Admitting: Psychiatry

## 2019-02-03 DIAGNOSIS — S72142A Displaced intertrochanteric fracture of left femur, initial encounter for closed fracture: Secondary | ICD-10-CM | POA: Insufficient documentation

## 2019-02-04 ENCOUNTER — Other Ambulatory Visit: Payer: Self-pay | Admitting: Psychiatry

## 2019-02-04 DIAGNOSIS — F5105 Insomnia due to other mental disorder: Secondary | ICD-10-CM

## 2019-02-04 DIAGNOSIS — F99 Mental disorder, not otherwise specified: Secondary | ICD-10-CM

## 2019-02-05 NOTE — Telephone Encounter (Signed)
Last visit 07/2018 due back in July

## 2019-04-05 ENCOUNTER — Other Ambulatory Visit: Payer: Self-pay | Admitting: Psychiatry

## 2019-04-05 DIAGNOSIS — F411 Generalized anxiety disorder: Secondary | ICD-10-CM

## 2019-04-05 DIAGNOSIS — F39 Unspecified mood [affective] disorder: Secondary | ICD-10-CM

## 2019-05-03 DIAGNOSIS — M25552 Pain in left hip: Secondary | ICD-10-CM | POA: Insufficient documentation

## 2019-05-12 DIAGNOSIS — Z8781 Personal history of (healed) traumatic fracture: Secondary | ICD-10-CM | POA: Insufficient documentation

## 2019-05-12 DIAGNOSIS — Z8262 Family history of osteoporosis: Secondary | ICD-10-CM | POA: Insufficient documentation

## 2019-05-12 DIAGNOSIS — K219 Gastro-esophageal reflux disease without esophagitis: Secondary | ICD-10-CM | POA: Insufficient documentation

## 2019-05-12 DIAGNOSIS — M810 Age-related osteoporosis without current pathological fracture: Secondary | ICD-10-CM | POA: Insufficient documentation

## 2019-05-12 HISTORY — DX: Gastro-esophageal reflux disease without esophagitis: K21.9

## 2019-06-04 ENCOUNTER — Other Ambulatory Visit: Payer: Self-pay | Admitting: Psychiatry

## 2019-06-04 DIAGNOSIS — F39 Unspecified mood [affective] disorder: Secondary | ICD-10-CM

## 2019-06-04 NOTE — Telephone Encounter (Signed)
Was due back in July, last visit Jan 2020

## 2019-06-23 DIAGNOSIS — K287 Chronic gastrojejunal ulcer without hemorrhage or perforation: Secondary | ICD-10-CM | POA: Insufficient documentation

## 2019-06-25 ENCOUNTER — Other Ambulatory Visit: Payer: Self-pay | Admitting: Orthopedic Surgery

## 2019-06-25 DIAGNOSIS — S72145D Nondisplaced intertrochanteric fracture of left femur, subsequent encounter for closed fracture with routine healing: Secondary | ICD-10-CM

## 2019-06-29 ENCOUNTER — Ambulatory Visit (INDEPENDENT_AMBULATORY_CARE_PROVIDER_SITE_OTHER): Payer: 59 | Admitting: Psychiatry

## 2019-06-29 ENCOUNTER — Encounter: Payer: Self-pay | Admitting: Psychiatry

## 2019-06-29 ENCOUNTER — Other Ambulatory Visit: Payer: Self-pay

## 2019-06-29 DIAGNOSIS — F99 Mental disorder, not otherwise specified: Secondary | ICD-10-CM | POA: Diagnosis not present

## 2019-06-29 DIAGNOSIS — F411 Generalized anxiety disorder: Secondary | ICD-10-CM | POA: Diagnosis not present

## 2019-06-29 DIAGNOSIS — F39 Unspecified mood [affective] disorder: Secondary | ICD-10-CM

## 2019-06-29 DIAGNOSIS — F5105 Insomnia due to other mental disorder: Secondary | ICD-10-CM

## 2019-06-29 MED ORDER — FLUOXETINE HCL 20 MG PO CAPS: 60.0000 mg | ORAL_CAPSULE | Freq: Every day | ORAL | 1 refills | Status: DC

## 2019-06-29 MED ORDER — LORAZEPAM 1 MG PO TABS: ORAL_TABLET | ORAL | 5 refills | Status: DC

## 2019-06-29 MED ORDER — ARIPIPRAZOLE 2 MG PO TABS: 2.0000 mg | ORAL_TABLET | Freq: Every day | ORAL | 1 refills | Status: DC

## 2019-06-29 MED ORDER — TOPIRAMATE 100 MG PO TABS: 200.0000 mg | ORAL_TABLET | Freq: Every day | ORAL | 1 refills | Status: DC

## 2019-06-29 NOTE — Progress Notes (Signed)
Misty MARCHAND [id number] 28-Dec-1955 63 y.o.  Virtual Visit via Telephone Note  I connected with pt on 06/29/19 at  8:30 AM EST by telephone and verified that I am speaking with the correct person using two identifiers.   I discussed the limitations, risks, security and privacy concerns of performing an evaluation and management service by telephone and the availability of in person appointments. I also discussed with the patient that there may be a patient responsible charge related to this service. The patient expressed understanding and agreed to proceed.   I discussed the assessment and treatment plan with the patient. The patient was provided an opportunity to ask questions and all were answered. The patient agreed with the plan and demonstrated an understanding of the instructions.   The patient was advised to call back or seek an in-person evaluation if the symptoms worsen or if the condition fails to improve as anticipated.  I provided 30 minutes of non-face-to-face time during this encounter.  The patient was located at home.  The provider was located at [place].   Thayer Headings, PMHNP   Subjective:   Patient ID:  Misty Howard is a 63 y.o. (DOB 20-Jul-1956) female.  Chief Complaint:  Chief Complaint  Patient presents with  . Follow-up    History of depression, anxiety, and insomnia    HPI Misty Howard presents for follow-up of She reports that her anxiety is high at times due situational stressors. Reports that smart watch indicates that her sleep is disrupted with at least 3-4 awakenings a night. Smart watch indicates that she sometimes is only sleeping 4-5 hours some days, and sometimes more. She reports that her mood has been "really good, especially with all I have been through." She reports that she has gained 20 lbs with being out of work with injuries and people bringing her food. Reports concentration difficulties. She reports that her energy and  motivation have been low with recent medical issues and this has been improving some with return to work. Denies SI.   Reports that she fell in June and broke her nose and July fell and fx'd her hip. Recently had another fall and fx'd pubic bone.   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   Past Psychiatric Medication Trials: Prozac-taken for 20 to 30 years. Lexapro-not as effective as Prozac Paxil- severe continuation signs and symptoms Zoloft-ineffective Wellbutrin Abilify Geodon-severe daytime somnolence Ativan-taken for 20 to 30 years Topamax-helpful for mood and migraines. Lamictal Risperdal BuSpar-ineffective Sonata Ambien   Review of Systems:  Review of Systems  Endocrine:       Reports episodes of hypoglycemia with dizziness and shakiness.   Musculoskeletal: Negative for gait problem.       Recent fractures and dx'd with osteoporosis.   Neurological: Positive for dizziness and tremors.  Psychiatric/Behavioral:       Please refer to HPI   Denies any involuntary movements.   Medications: I have reviewed the patient's current medications.  Current Outpatient Medications  Medication Sig Dispense Refill  . ARIPiprazole (ABILIFY) 2 MG tablet Take 1 tablet (2 mg total) by mouth daily. 90 tablet 1  . Cyanocobalamin (VITAMIN B-12) 5000 MCG SUBL Place 1 tablet under the tongue at bedtime.    Marland Kitchen FLUoxetine (PROZAC) 20 MG capsule Take 3 capsules (60 mg total) by mouth at bedtime. 270 capsule 1  . [START ON 07/02/2019] LORazepam (ATIVAN) 1 MG tablet Take  2.5 tabs po QHS 75 tablet 5  . Multiple Vitamins-Minerals (CENTRUM SILVER 50+WOMEN) TABS Take 1 tablet by mouth at bedtime.    . pantoprazole (PROTONIX) 40 MG tablet Take 40 mg by mouth at bedtime.     . topiramate (TOPAMAX) 100 MG tablet Take 2 tablets (200 mg total) by mouth at bedtime. 180 tablet 1  . cyclobenzaprine (FLEXERIL) 5 MG tablet Can use one tablet up to  twice daily only if needed for back spasms. (Patient not taking: Reported on 01/22/2019) 30 tablet 0  . promethazine (PHENERGAN) 25 MG tablet Take 1 tablet (25 mg total) by mouth every 8 (eight) hours as needed for up to 5 days for nausea or vomiting. 15 tablet 0   No current facility-administered medications for this visit.    Medication Side Effects: None  Allergies:  Allergies  Allergen Reactions  . Codeine Phosphate Nausea And Vomiting  . Lipitor [Atorvastatin] Other (See Comments)    Muscle pain  . Morphine And Related Other (See Comments)    Causes pt to sweat and makes her crazy  . Nsaids Other (See Comments)    Gastric bypass surgery history  . Statins Other (See Comments)    Muscle pain  . Zocor [Simvastatin] Other (See Comments)    Muscle pain    Past Medical History:  Diagnosis Date  . Anxiety   . Bipolar disorder ([place])   . Cancer ([place])   . Depression   . Esophageal ring    s/p dilation 2003  . Headache(784.0)    frequent  . Hepatic steatosis   . Hyperlipidemia   . LAE (left atrial enlargement)    per records on echo in 2008  . Migraine   . Protein calorie malnutrition ([place])     Family History  Problem Relation Age of Onset  . Arthritis Mother   . Cancer Mother        breast  . Hyperlipidemia Mother   . Depression Mother   . Alcohol abuse Father   . Arthritis Father   . Heart disease Father   . Stroke Father   . Hypertension Father   . Cancer Maternal Grandmother   . Arthritis Maternal Grandfather   . Hyperlipidemia Maternal Grandfather   . Alcohol abuse Paternal Grandfather   . Depression Sister     Social History   Socioeconomic History  . Marital status: Married    Spouse name: Not on file  . Number of children: Not on file  . Years of education: Not on file  . Highest education level: Not on file  Occupational History  . Not on file  Tobacco Use  . Smoking status: Former Research scientist (life sciences)  . Smokeless tobacco: Never Used  Substance and  Sexual Activity  . Alcohol use: Yes  . Drug use: Not on file  . Sexual activity: Not on file  Other Topics Concern  . Not on file  Social History Narrative  . Not on file   Social Determinants of Health   Financial Resource Strain:   . Difficulty of Paying Living Expenses: Not on file  Food Insecurity:   . Worried About Charity fundraiser in the Last Year: Not on file  . Ran Out of Food in the Last Year: Not on file  Transportation Needs:   . Lack of Transportation (Medical): Not on file  . Lack of Transportation (Non-Medical): Not on file  Physical Activity:   . Days of Exercise per Week: Not on file  .  Minutes of Exercise per Session: Not on file  Stress:   . Feeling of Stress : Not on file  Social Connections:   . Frequency of Communication with Friends and Family: Not on file  . Frequency of Social Gatherings with Friends and Family: Not on file  . Attends Religious Services: Not on file  . Active Member of Clubs or Organizations: Not on file  . Attends Archivist Meetings: Not on file  . Marital Status: Not on file  Intimate Partner Violence:   . Fear of Current or Ex-Partner: Not on file  . Emotionally Abused: Not on file  . Physically Abused: Not on file  . Sexually Abused: Not on file    Past Medical History, Surgical history, Social history, and Family history were reviewed and updated as appropriate.   Please see review of systems for further details on the patient's review from today.   Objective:   Physical Exam:  Pulse 84   Physical Exam Neurological:     Mental Status: She is alert and oriented to person, place, and time.     Cranial Nerves: No dysarthria.  Psychiatric:        Attention and Perception: Attention normal.        Mood and Affect: Mood normal.        Speech: Speech normal.        Behavior: Behavior is cooperative.        Thought Content: Thought content normal. Thought content is not paranoid or delusional. Thought content  does not include homicidal or suicidal ideation. Thought content does not include homicidal or suicidal plan.        Cognition and Memory: Cognition and memory normal.        Judgment: Judgment normal.     Lab Review:     Component Value Date/Time   NA 136 01/25/2019 0310   K 3.9 01/25/2019 0310   CL 107 01/25/2019 0310   CO2 24 01/25/2019 0310   GLUCOSE 121 (H) 01/25/2019 0310   BUN 12 01/25/2019 0310   CREATININE 0.62 01/25/2019 0310   CALCIUM 8.3 (L) 01/25/2019 0310   PROT 6.2 (L) 01/25/2019 0310   ALBUMIN 3.1 (L) 01/25/2019 0310   AST 15 01/25/2019 0310   ALT 12 01/25/2019 0310   ALKPHOS 58 01/25/2019 0310   BILITOT 0.6 01/25/2019 0310   GFRNONAA >60 01/25/2019 0310   GFRAA >60 01/25/2019 0310       Component Value Date/Time   WBC 7.2 01/25/2019 0310   RBC 3.07 (L) 01/25/2019 0310   HGB 9.3 (L) 01/25/2019 0310   HCT 30.6 (L) 01/25/2019 0310   PLT 220 01/25/2019 0310   MCV 99.7 01/25/2019 0310   MCH 30.3 01/25/2019 0310   MCHC 30.4 01/25/2019 0310   RDW 13.3 01/25/2019 0310   LYMPHSABS 0.8 01/21/2019 2331   MONOABS 0.4 01/21/2019 2331   EOSABS 0.0 01/21/2019 2331   BASOSABS 0.0 01/21/2019 2331    No results found for: POCLITH, LITHIUM   No results found for: PHENYTOIN, PHENOBARB, VALPROATE, CBMZ   .res Assessment: Plan:   Will continue current plan of care since target signs and symptoms are well controlled without any tolerability issues. Patient to follow-up in 6 months or sooner if clinically indicated. Patient advised to contact office with any questions, adverse effects, or acute worsening in signs and symptoms.  Viya was seen today for follow-up.  Diagnoses and all orders for this visit:  Insomnia due to other mental  disorder Comments: Controlled with Ativan Orders: -     LORazepam (ATIVAN) 1 MG tablet; Take 2.5 tabs po QHS  Generalized anxiety disorder Comments: Chronic Orders: -     FLUoxetine (PROZAC) 20 MG capsule; Take 3 capsules (60  mg total) by mouth at bedtime. -     topiramate (TOPAMAX) 100 MG tablet; Take 2 tablets (200 mg total) by mouth at bedtime.  Mood disorder ([facility]) Comments: Chronic improved with Abilify Orders: -     FLUoxetine (PROZAC) 20 MG capsule; Take 3 capsules (60 mg total) by mouth at bedtime. -     ARIPiprazole (ABILIFY) 2 MG tablet; Take 1 tablet (2 mg total) by mouth daily. -     topiramate (TOPAMAX) 100 MG tablet; Take 2 tablets (200 mg total) by mouth at bedtime.    Please see After Visit Summary for patient specific instructions.  Future Appointments  Date Time Provider [place]  07/02/2019  9:20 AM [facility] 1 [facility]    No orders of the defined types were placed in this encounter.     -------------------------------

## 2019-07-02 ENCOUNTER — Other Ambulatory Visit: Payer: Self-pay

## 2019-07-02 ENCOUNTER — Ambulatory Visit
Admission: RE | Admit: 2019-07-02 | Discharge: 2019-07-02 | Disposition: A | Discharge status: Home / Self Care | Payer: 59 | Source: Ambulatory Visit | Attending: Orthopedic Surgery | Admitting: Orthopedic Surgery

## 2019-07-02 DIAGNOSIS — S72145D Nondisplaced intertrochanteric fracture of left femur, subsequent encounter for closed fracture with routine healing: Secondary | ICD-10-CM

## 2019-07-29 ENCOUNTER — Other Ambulatory Visit: Payer: Self-pay | Admitting: Psychiatry

## 2019-07-29 DIAGNOSIS — F5105 Insomnia due to other mental disorder: Secondary | ICD-10-CM

## 2019-07-30 NOTE — Telephone Encounter (Signed)
Already submitted on 12/08 with refills

## 2019-08-01 ENCOUNTER — Other Ambulatory Visit: Payer: Self-pay | Admitting: Psychiatry

## 2019-08-01 DIAGNOSIS — F5105 Insomnia due to other mental disorder: Secondary | ICD-10-CM

## 2019-08-18 NOTE — Telephone Encounter (Signed)
Submitted with 5 refills

## 2019-12-29 ENCOUNTER — Other Ambulatory Visit: Payer: Self-pay | Admitting: Psychiatry

## 2019-12-29 DIAGNOSIS — F5105 Insomnia due to other mental disorder: Secondary | ICD-10-CM

## 2019-12-29 NOTE — Telephone Encounter (Signed)
Last apt 06/2019

## 2020-01-08 IMAGING — CR LEFT KNEE - COMPLETE 4+ VIEW
4 series · 4 of 4 positions shown · non-contrast
Comparison: None.

CLINICAL DATA: Fractured hip.  Preop

EXAM:
LEFT KNEE - COMPLETE 4+ VIEW

[x knee ap left]
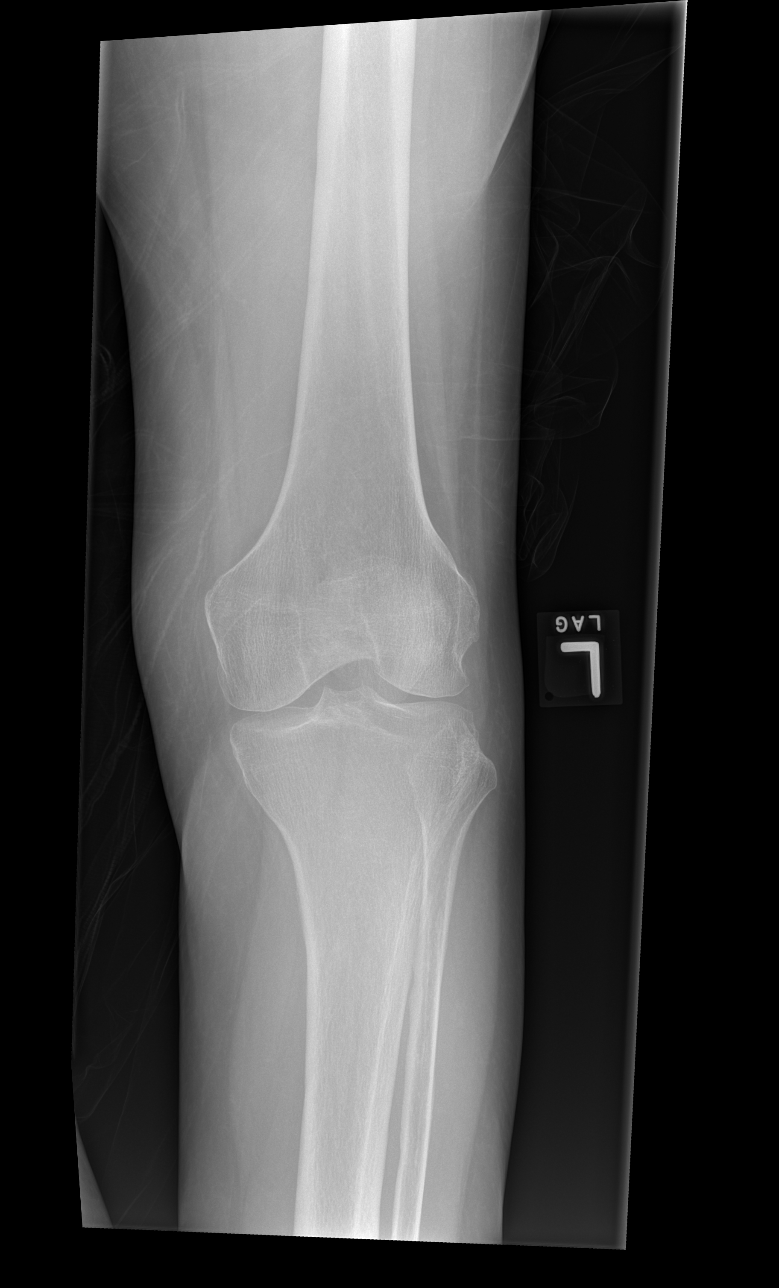

[x knee obl left (1 of 2)]
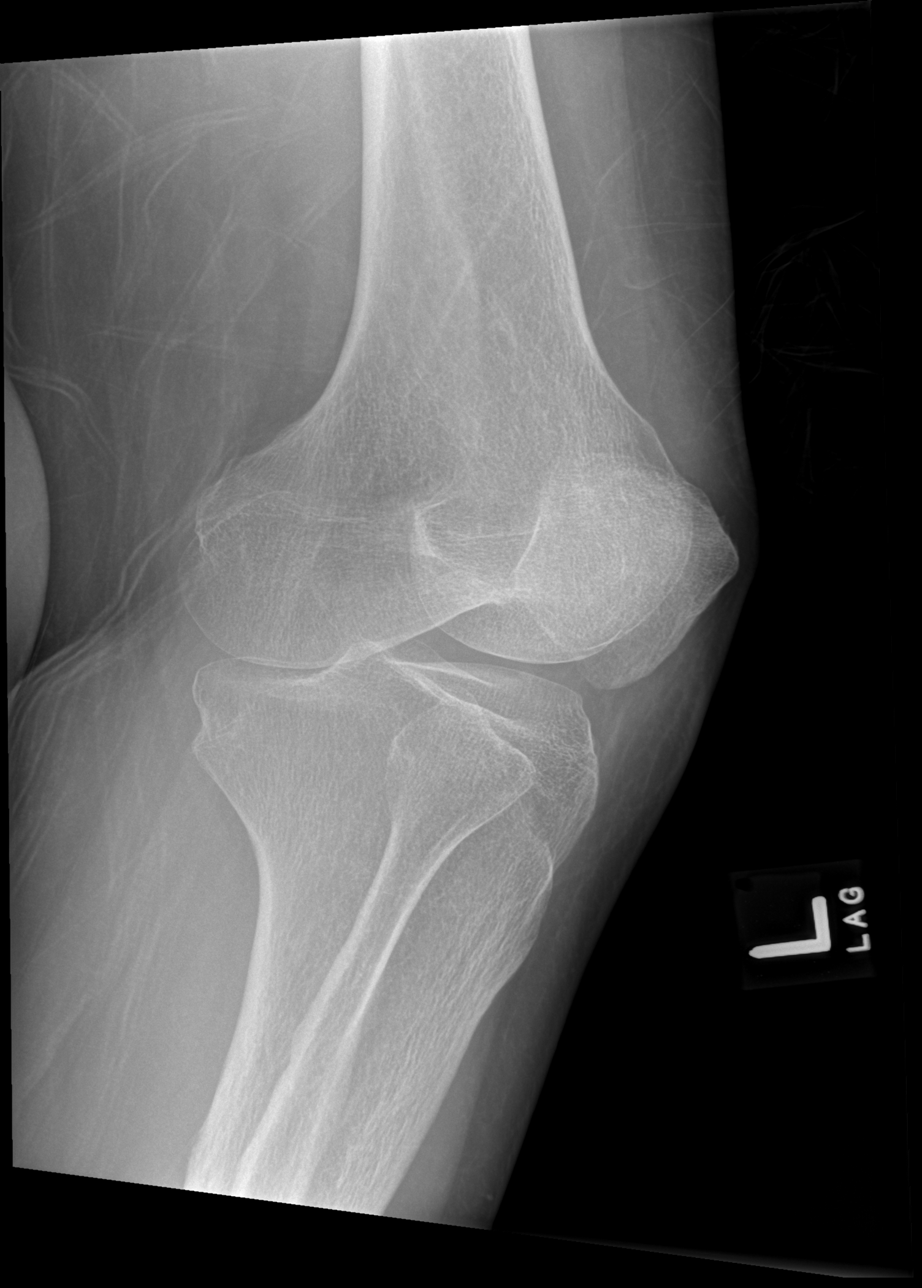

[x knee obl left (2 of 2)]
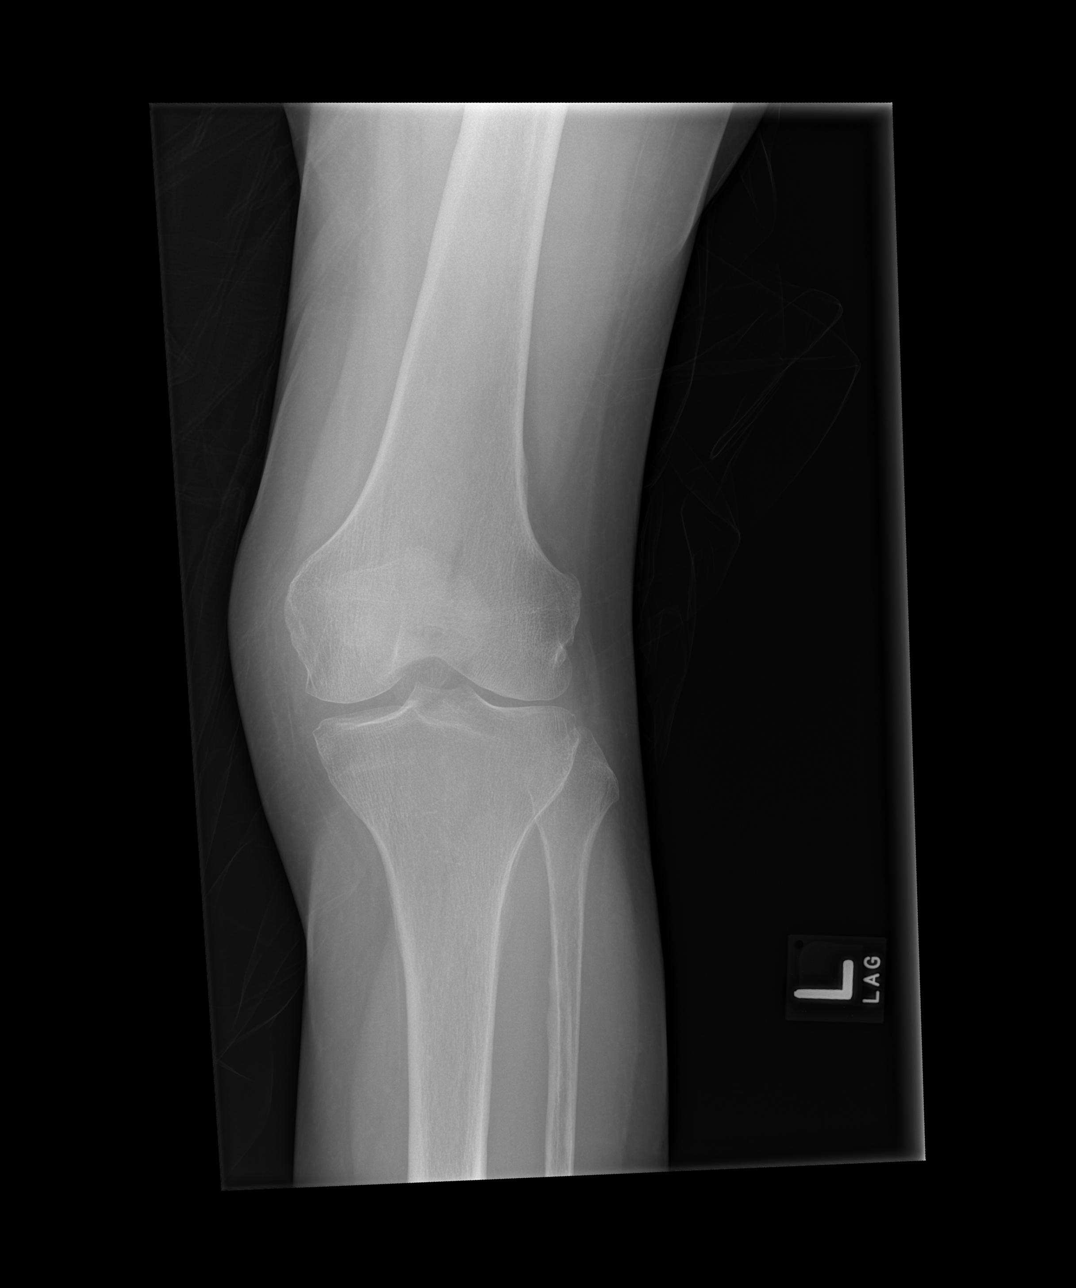

[x knee lat left]
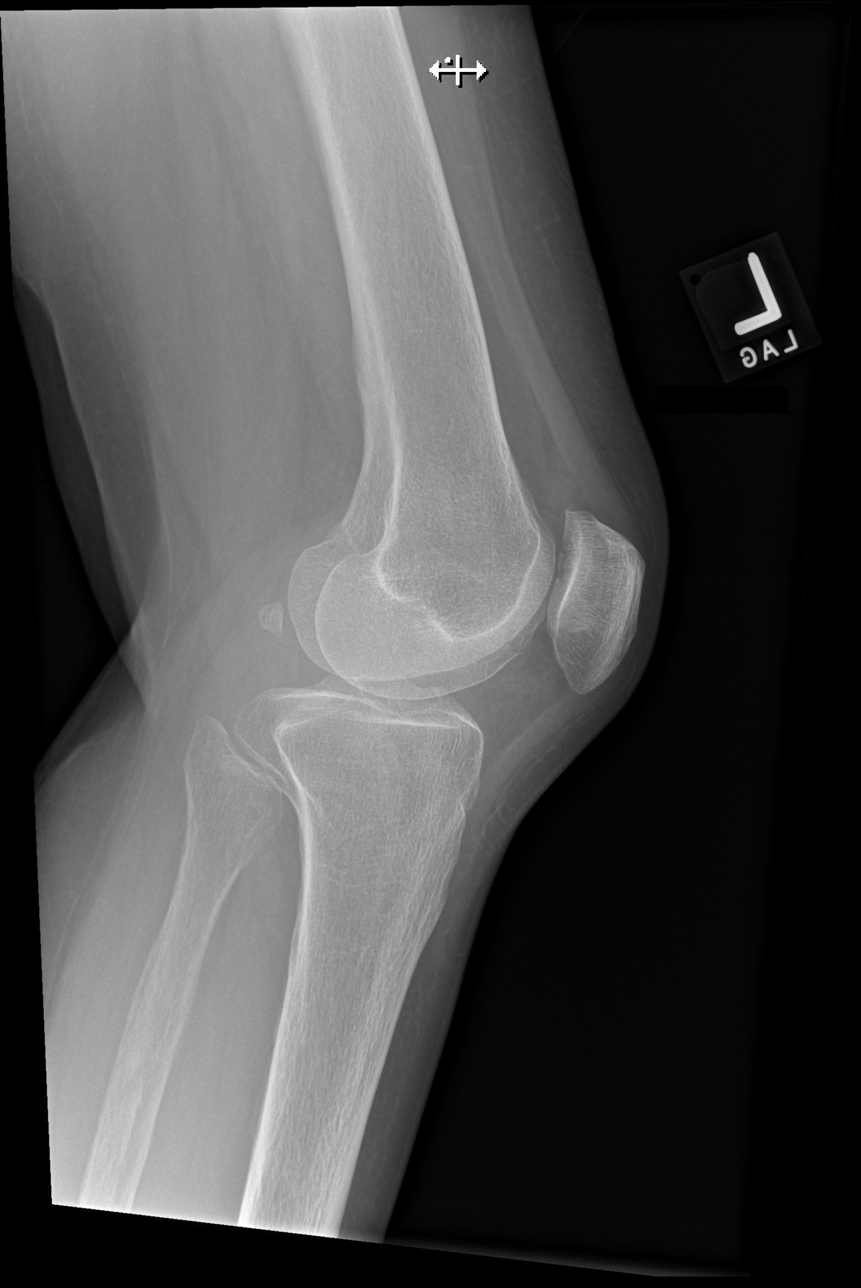

[4 of 4 positions shown; findings below may reference images not displayed]

FINDINGS: No acute bony abnormality. Specifically, no fracture, subluxation,
or dislocation. Early joint space narrowing in the patellofemoral
compartment. No joint effusion.
IMPRESSION: No acute bony abnormality.

## 2020-01-08 IMAGING — CR CHEST  1 VIEW
1 series · 1 of 1 positions shown · non-contrast
Comparison: None.

CLINICAL DATA: Preop, hip fracture

EXAM:
CHEST  1 VIEW

[x chest ap]
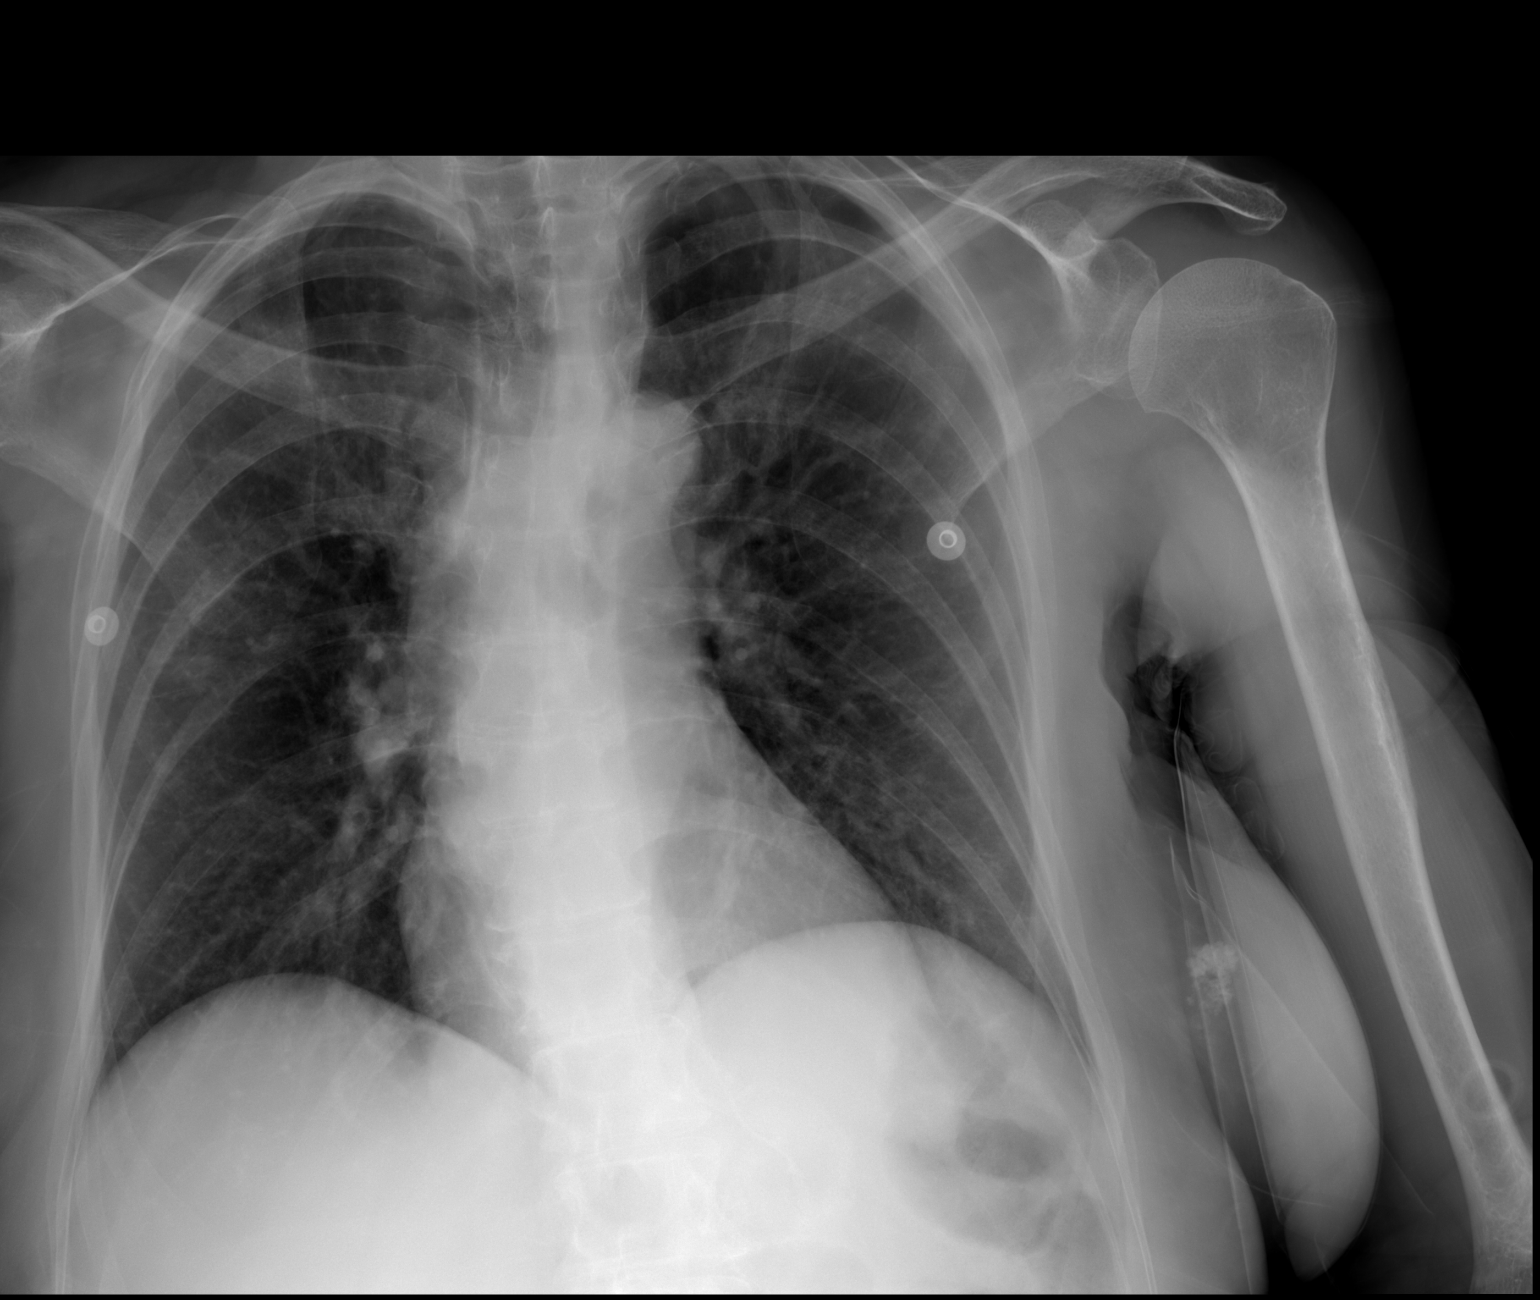

[1 of 1 positions shown; findings below may reference images not displayed]

FINDINGS: Heart and mediastinal contours are within normal limits. No focal
opacities or effusions. No acute bony abnormality.
IMPRESSION: No active disease.

## 2020-01-11 ENCOUNTER — Other Ambulatory Visit: Payer: Self-pay | Admitting: Psychiatry

## 2020-01-11 DIAGNOSIS — F39 Unspecified mood [affective] disorder: Secondary | ICD-10-CM

## 2020-01-11 DIAGNOSIS — F411 Generalized anxiety disorder: Secondary | ICD-10-CM

## 2020-01-13 ENCOUNTER — Other Ambulatory Visit: Payer: Self-pay

## 2020-01-13 ENCOUNTER — Encounter: Payer: Self-pay | Admitting: Psychiatry

## 2020-01-13 ENCOUNTER — Ambulatory Visit (INDEPENDENT_AMBULATORY_CARE_PROVIDER_SITE_OTHER): Payer: 59 | Admitting: Psychiatry

## 2020-01-13 DIAGNOSIS — F5105 Insomnia due to other mental disorder: Secondary | ICD-10-CM

## 2020-01-13 DIAGNOSIS — F39 Unspecified mood [affective] disorder: Secondary | ICD-10-CM | POA: Diagnosis not present

## 2020-01-13 DIAGNOSIS — F99 Mental disorder, not otherwise specified: Secondary | ICD-10-CM | POA: Diagnosis not present

## 2020-01-13 DIAGNOSIS — F411 Generalized anxiety disorder: Secondary | ICD-10-CM

## 2020-01-13 MED ORDER — FLUOXETINE HCL 20 MG PO CAPS: 60.0000 mg | ORAL_CAPSULE | Freq: Every day | ORAL | 1 refills | Status: DC

## 2020-01-13 MED ORDER — LORAZEPAM 1 MG PO TABS: ORAL_TABLET | ORAL | 5 refills | Status: DC

## 2020-01-13 MED ORDER — TOPIRAMATE 100 MG PO TABS: 200.0000 mg | ORAL_TABLET | Freq: Every day | ORAL | 1 refills | Status: DC

## 2020-01-13 MED ORDER — ARIPIPRAZOLE 2 MG PO TABS: 2.0000 mg | ORAL_TABLET | Freq: Every day | ORAL | 1 refills | Status: DC

## 2020-01-13 NOTE — Progress Notes (Signed)
   01/13/20 0903  Facial and Oral Movements  Muscles of Facial Expression 0  Lips and Perioral Area 0  Jaw 0  Tongue 0  Extremity Movements  Upper (arms, wrists, hands, fingers) 1  Lower (legs, knees, ankles, toes) 0  Trunk Movements  Neck, shoulders, hips 0  Overall Severity  Severity of abnormal movements (highest score from questions above) 0  Incapacitation due to abnormal movements 0  Patient's awareness of abnormal movements (rate only patient's report) 0  AIMS Total Score  AIMS Total Score 1

## 2020-01-13 NOTE — Progress Notes (Signed)
Misty Howard [id number] January 31, 1956 64 y.o.  Subjective:   Patient ID:  Misty Howard is a 64 y.o. (DOB 1955/11/24) female.  Chief Complaint:  Chief Complaint  Patient presents with  . Follow-up    h/o Anxiety, depression, and insomnia    HPI Misty Howard presents to the office today for follow-up of anxiety, depression, insomnia. She reports that he she fell down her staris 3 times in 2020. She reports that she injured her head falling up the stairs. Another time she tripped on bed sheets she was carrying down the stairs and stayed on the floor for several hours. She reports that third fall was when she was using a cane and slipped.   She is enjoying her job and has been there a little over a year. 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   She reports that she has been dreaming for the first time and it is sporadic. She reports that she has some "scary dreams." She reports that she has been falling and staying asleep without difficulty.  She reports that her mood has been "great." She reports that her anxiety has been well controlled. She reports that she had acute anxiety several months ago when a coworker "went off" on her. She reports that she did not sleep well and had diarrhea after this occurred. She reports that her energy has been low. Motivation has been good. She reports poor concentration and has difficulty remembering things and reports that she has upcoming neurology apt. She reports that her appetite has been good and has gained 50 lbs. She describes ST memory impairment. Reports that she is frequently forgetting where she placed objects or instructions with multiple steps. She reports that she has noticed progressive ST memory impairment. She reports that she has difficulty with word finding. Denies SI.   Past Psychiatric Medication Trials: Prozac-taken for 20 to 30 years. Lexapro-not as effective as Prozac Paxil-severe  continuation signs and symptoms Zoloft-ineffective Wellbutrin Abilify Geodon-severe daytime somnolence Ativan-taken for 20 to 30 years Topamax-helpful for mood and migraines. Lamictal Risperdal BuSpar-ineffective Sonata Ambien AIMS     Office Visit from 01/13/2020 in [facility]  AIMS Total Score 1       Review of Systems:  Review of Systems  Musculoskeletal: Negative for gait problem.  Neurological: Positive for tremors and speech difficulty.  Psychiatric/Behavioral:       Please refer to HPI    Medications: I have reviewed the patient's current medications.  Current Outpatient Medications  Medication Sig Dispense Refill  . ARIPiprazole (ABILIFY) 2 MG tablet Take 1 tablet (2 mg total) by mouth daily. 90 tablet 1  . calcium carbonate (OS-CAL - DOSED IN MG OF ELEMENTAL CALCIUM) 1250 (500 Ca) MG tablet Take 1 tablet by mouth.    . Cyanocobalamin (VITAMIN B-12) 5000 MCG SUBL Place 1 tablet under the tongue at bedtime.    Marland Kitchen FLUoxetine (PROZAC) 20 MG capsule Take 3 capsules (60 mg total) by mouth at bedtime. 270 capsule 1  . [START ON 01/27/2020] LORazepam (ATIVAN) 1 MG tablet TAKE 2&1/2 TABLETS AT BEDTIME. 75 tablet 5  . Multiple Vitamins-Minerals (CENTRUM SILVER 50+WOMEN) TABS Take 1 tablet by mouth at bedtime.    . pantoprazole (PROTONIX) 40 MG tablet Take 40 mg by mouth at bedtime.     . topiramate (TOPAMAX) 100 MG tablet Take 2 tablets (200 mg total) by mouth at bedtime. 180 tablet 1  .  TYMLOS 3120 MCG/1.56ML SOPN     . promethazine (PHENERGAN) 25 MG tablet Take 1 tablet (25 mg total) by mouth every 8 (eight) hours as needed for up to 5 days for nausea or vomiting. 15 tablet 0   No current facility-administered medications for this visit.    Medication Side Effects: None  Allergies:  Allergies  Allergen Reactions  . Codeine Phosphate Nausea And Vomiting  . Lipitor [Atorvastatin] Other (See Comments)    Muscle pain  . Morphine And Related Other (See  Comments)    Causes pt to sweat and makes her crazy  . Nsaids Other (See Comments)    Gastric bypass surgery history  . Statins Other (See Comments)    Muscle pain  . Zocor [Simvastatin] Other (See Comments)    Muscle pain    Past Medical History:  Diagnosis Date  . Anxiety   . Bipolar disorder ([place])   . Cancer ([place])   . Depression   . Esophageal ring    s/p dilation 2003  . Headache(784.0)    frequent  . Hepatic steatosis   . Hyperlipidemia   . LAE (left atrial enlargement)    per records on echo in 2008  . Migraine   . Protein calorie malnutrition ([place])     Family History  Problem Relation Age of Onset  . Arthritis Mother   . Cancer Mother        breast  . Hyperlipidemia Mother   . Depression Mother   . Alcohol abuse Father   . Arthritis Father   . Heart disease Father   . Stroke Father   . Hypertension Father   . Cancer Maternal Grandmother   . Arthritis Maternal Grandfather   . Hyperlipidemia Maternal Grandfather   . Alcohol abuse Paternal Grandfather   . Depression Sister     Social History   Socioeconomic History  . Marital status: Married    Spouse name: Not on file  . Number of children: Not on file  . Years of education: Not on file  . Highest education level: Not on file  Occupational History  . Not on file  Tobacco Use  . Smoking status: Former Research scientist (life sciences)  . Smokeless tobacco: Never Used  Substance and Sexual Activity  . Alcohol use: Yes  . Drug use: Not on file  . Sexual activity: Not on file  Other Topics Concern  . Not on file  Social History Narrative  . Not on file   Social Determinants of Health   Financial Resource Strain:   . Difficulty of Paying Living Expenses:   Food Insecurity:   . Worried About Charity fundraiser in the Last Year:   . Arboriculturist in the Last Year:   Transportation Needs:   . Film/video editor (Medical):   Marland Kitchen Lack of Transportation (Non-Medical):   Physical Activity:   . Days of Exercise per  Week:   . Minutes of Exercise per Session:   Stress:   . Feeling of Stress :   Social Connections:   . Frequency of Communication with Friends and Family:   . Frequency of Social Gatherings with Friends and Family:   . Attends Religious Services:   . Active Member of Clubs or Organizations:   . Attends Archivist Meetings:   Marland Kitchen Marital Status:   Intimate Partner Violence:   . Fear of Current or Ex-Partner:   . Emotionally Abused:   Marland Kitchen Physically Abused:   . Sexually  Abused:     Past Medical History, Surgical history, Social history, and Family history were reviewed and updated as appropriate.   Please see review of systems for further details on the patient's review from today.   Objective:   Physical Exam:  BP 122/76   Pulse 97   Wt 151 lb (68.5 kg)   BMI 25.13 kg/m   Physical Exam Constitutional:      General: She is not in acute distress. Musculoskeletal:        General: No deformity.  Neurological:     Mental Status: She is alert and oriented to person, place, and time.     Coordination: Coordination normal.  Psychiatric:        Attention and Perception: Attention and perception normal. She does not perceive auditory or visual hallucinations.        Mood and Affect: Mood normal. Mood is not anxious or depressed. Affect is not labile, blunt, angry or inappropriate.        Speech: Speech normal.        Behavior: Behavior normal.        Thought Content: Thought content normal. Thought content is not paranoid or delusional. Thought content does not include homicidal or suicidal ideation. Thought content does not include homicidal or suicidal plan.        Cognition and Memory: Cognition and memory normal.        Judgment: Judgment normal.     Comments: Insight intact     Lab Review:     Component Value Date/Time   NA 136 01/25/2019 0310   K 3.9 01/25/2019 0310   CL 107 01/25/2019 0310   CO2 24 01/25/2019 0310   GLUCOSE 121 (H) 01/25/2019 0310   BUN 12  01/25/2019 0310   CREATININE 0.62 01/25/2019 0310   CALCIUM 8.3 (L) 01/25/2019 0310   PROT 6.2 (L) 01/25/2019 0310   ALBUMIN 3.1 (L) 01/25/2019 0310   AST 15 01/25/2019 0310   ALT 12 01/25/2019 0310   ALKPHOS 58 01/25/2019 0310   BILITOT 0.6 01/25/2019 0310   GFRNONAA >60 01/25/2019 0310   GFRAA >60 01/25/2019 0310       Component Value Date/Time   WBC 7.2 01/25/2019 0310   RBC 3.07 (L) 01/25/2019 0310   HGB 9.3 (L) 01/25/2019 0310   HCT 30.6 (L) 01/25/2019 0310   PLT 220 01/25/2019 0310   MCV 99.7 01/25/2019 0310   MCH 30.3 01/25/2019 0310   MCHC 30.4 01/25/2019 0310   RDW 13.3 01/25/2019 0310   LYMPHSABS 0.8 01/21/2019 2331   MONOABS 0.4 01/21/2019 2331   EOSABS 0.0 01/21/2019 2331   BASOSABS 0.0 01/21/2019 2331    No results found for: POCLITH, LITHIUM   No results found for: PHENYTOIN, PHENOBARB, VALPROATE, CBMZ   .res Assessment: Plan:   Will continue current plan of care since target signs and symptoms are well controlled without any tolerability issues. Advised patient to contact office if urologist attributes recent memory difficulties and word finding errors continue her current medications.  Discussed that Topamax can potentially cause these side effects, however she has taken Topamax long-term and did not have these signs and symptoms until recently. Continue Topamax 200 mg at bedtime for anxiety and insomnia. Continue Prozac 60 mg daily for anxiety and depression. Continue Ativan 2 mg at bedtime for insomnia. Continue Abilify 2 mg daily for depression. Patient to follow-up in 6 months or sooner if clinically indicated. Patient advised to contact office with any questions,  adverse effects, or acute worsening in signs and symptoms.  Shaine was seen today for follow-up.  Diagnoses and all orders for this visit:  Generalized anxiety disorder Comments: Chronic Orders: -     topiramate (TOPAMAX) 100 MG tablet; Take 2 tablets (200 mg total) by mouth at  bedtime. -     FLUoxetine (PROZAC) 20 MG capsule; Take 3 capsules (60 mg total) by mouth at bedtime.  Mood disorder ([place]) Comments: Chronic improved with Abilify Orders: -     topiramate (TOPAMAX) 100 MG tablet; Take 2 tablets (200 mg total) by mouth at bedtime. -     ARIPiprazole (ABILIFY) 2 MG tablet; Take 1 tablet (2 mg total) by mouth daily. -     FLUoxetine (PROZAC) 20 MG capsule; Take 3 capsules (60 mg total) by mouth at bedtime.  Insomnia due to other mental disorder Comments: Controlled with Ativan Orders: -     LORazepam (ATIVAN) 1 MG tablet; TAKE 2&1/2 TABLETS AT BEDTIME.     Please see After Visit Summary for patient specific instructions.  Future Appointments  Date Time Provider [place]  03/03/2020 10:00 AM [name], [facility] None  07/20/2020  8:30 AM Thayer Headings, PMHNP CP-CP None    No orders of the defined types were placed in this encounter.   -------------------------------

## 2020-03-03 ENCOUNTER — Ambulatory Visit (INDEPENDENT_AMBULATORY_CARE_PROVIDER_SITE_OTHER): Payer: 59 | Admitting: Psychiatry

## 2020-03-03 ENCOUNTER — Other Ambulatory Visit: Payer: Self-pay

## 2020-03-03 DIAGNOSIS — F411 Generalized anxiety disorder: Secondary | ICD-10-CM | POA: Diagnosis not present

## 2020-03-03 NOTE — Progress Notes (Signed)
[facility] Counselor/Therapist Progress Note  Patient ID: LAKESHA LEVINSON, MRN: [id number],    Date: 03/03/2020  Time Spent: 51 minutes start time 10:07 AM end time 10:58 AM  Treatment Type: Individual Therapy  Reported Symptoms: anxiety, sadness, sleep issues, weight gain  Mental Status Exam:  Appearance:   Well Groomed     Behavior:  Appropriate  Motor:  Normal  Speech/Language:   Normal Rate  Affect:  Appropriate  Mood:  normal  Thought process:  normal  Thought content:    WNL  Sensory/Perceptual disturbances:    WNL  Orientation:  oriented to person, place, time/date and situation  Attention:  Good  Concentration:  Good  Memory:  WNL  Fund of knowledge:   Good  Insight:    Good  Judgment:   Good  Impulse Control:  Good   Risk Assessment: Danger to Self:  No Self-injurious Behavior: No Danger to Others: No Duty to Warn:no Physical Aggression / Violence:No  Access to Firearms a concern: No  Gang Involvement:No   Subjective: Patient was present for session.  Patient was present for session.  She shared that a lot has happened since last session in 2019. She explained she fell in love with someone and got scammed for $20,000.00.  He was not who he said he was and not real.  She shared that the children pulled their children away from her.  She decided to move to [place] and than they had an [place] [employer] [place].  It was awful because the shelves were cleared out and there was nothing for her to eat.  She would go days without eating. It was hard for her to get home due to airports being closed. She was able to get home and family still wasn't speaking to her. She ended up meeting someone to date and he is good as gold to her but she is not in love with him.  She stated that her heart is still with her EX who only cares about himself. She enjoys her job that she has currently at [employer], she has her own office and a desk job. She fell down her steps and had to have a  rod in her leg and it was bad.  Her youngest son has a severe heat condition that worries her.  Patient explained she wants to figure out how to move forward with her life.  She shared that she realizes her husband is not good for her and she wants to be able to except the caring of somebody else.  Patient developed treatment plan in session could not sign due to coronavirus.  Patient was encouraged to start focusing on books that help her find ways to take care of her brain through diet.  She was encouraged to take things 1 step at a time focus first on her own self-care.  Interventions: Solution-Oriented/Positive Psychology  Diagnosis:   ICD-10-CM   1. Generalized anxiety disorder  F41.1     Plan: Patient is to utilize coping skills to decrease anxiety symptoms.  Patient is to work on books that discussed foods that are good to the brain.  Patient is to remind herself that she matters. Long-term goal: Resolve the core conflict that is the source of anxiety Short-term goal: Identify the major life complex in the past and present the form the basis for present anxiety  [name], [facility]

## 2020-03-03 NOTE — Progress Notes (Deleted)
[facility] Counselor/Therapist Progress Note  Patient ID: Misty Howard, MRN: [id number],    Date: 03/03/2020  Time Spent: 51 minutes start time 10:07 AM end time 10:58 AM  Treatment Type: Individual Therapy  Reported Symptoms: anxiety, sadness, sleep issues, weight gain  Mental Status Exam:  Appearance:   Well Groomed     Behavior:  Appropriate  Motor:  Normal  Speech/Language:   Normal Rate  Affect:  Appropriate  Mood:  normal  Thought process:  normal  Thought content:    WNL  Sensory/Perceptual disturbances:    WNL  Orientation:  oriented to person, place, time/date and situation  Attention:  Good  Concentration:  Good  Memory:  WNL  Fund of knowledge:   Good  Insight:    Good  Judgment:   Good  Impulse Control:  Good   Risk Assessment: Danger to Self:  No Self-injurious Behavior: No Danger to Others: No Duty to Warn:no Physical Aggression / Violence:No  Access to Firearms a concern: No  Gang Involvement:No   Subjective: Patient was present for session.  Patient was present for session.  She shared that a lot has happened since last session in 2019. She explained she fell in love with someone and got scammed for $20,000.00.  He was not who he said he was and not real.  She shared that the children pulled their children away from her.  She decided to move to [place] and than they had an [place].  It was awful because the shelves were cleared out and there was nothing for her to eat.  She would go days without eating. It was hard for her to get home due to airports being closed. She was able to get home and family still wasn't speaking to her. She ended up meeting someone to date and he is good as gold to her but she is not in love with him.  She stated that her heart is still with her EX who only cares about himself. She enjoys her job that she has currently at [employer], she has her own office and a desk job. She fell down her steps and had to have a  rod in her leg and it was bad.  Her youngest son has a severe heat condition that worries her.  Patient explained she wants to figure out how to move forward with her life.  She shared that she realizes her husband is not good for her and she wants to be able to except the caring of somebody else.  Patient developed treatment plan in session could not sign due to coronavirus.  Patient was encouraged to start focusing on books that help her find ways to take care of her brain through diet.  She was encouraged to take things 1 step at a time focus first on her own self-care.  Interventions: Solution-Oriented/Positive Psychology  Diagnosis:   ICD-10-CM   1. Generalized anxiety disorder  F41.1     Plan: Patient is to utilize coping skills to decrease anxiety symptoms.  Patient is to work on books that discussed foods that are good to the brain.  Patient is to remind herself that she matters. Long-term goal: Resolve the core conflict that is the source of anxiety Short-term goal: Identify the major life complex in the past and present the form the basis for present anxiety  [name], [facility]

## 2020-03-16 ENCOUNTER — Other Ambulatory Visit: Payer: Self-pay

## 2020-03-16 ENCOUNTER — Ambulatory Visit (INDEPENDENT_AMBULATORY_CARE_PROVIDER_SITE_OTHER): Payer: 59 | Admitting: Psychiatry

## 2020-03-16 DIAGNOSIS — F411 Generalized anxiety disorder: Secondary | ICD-10-CM | POA: Diagnosis not present

## 2020-03-16 NOTE — Progress Notes (Signed)
[facility] Counselor/Therapist Progress Note  Patient ID: Misty Howard, MRN: [id number],    Date: 03/16/2020  Time Spent: 52 minutes start time 2:06 PM end time 2:58 PM  Treatment Type: Individual Therapy  Reported Symptoms: anxiety, sadness, crying spells, grief  Mental Status Exam:  Appearance:   Well Groomed     Behavior:  Appropriate  Motor:  Normal  Speech/Language:   Normal Rate  Affect:  Appropriate  Mood:  anxious and sad  Thought process:  normal  Thought content:    WNL  Sensory/Perceptual disturbances:    WNL  Orientation:  oriented to person, place, time/date and situation  Attention:  Good  Concentration:  Good  Memory:  WNL  Fund of knowledge:   Good  Insight:    Good  Judgment:   Good  Impulse Control:  Good   Risk Assessment: Danger to Self:  No Self-injurious Behavior: No Danger to Others: No Duty to Warn:no Physical Aggression / Violence:No  Access to Firearms a concern: No  Gang Involvement:No   Subjective: Patient was present for session. She shared that her aunt died while she was on a trip to [place].  Patient explained that she had a very different response to her aunt dying.  She shared that for a long time she was just in shock and could not believe it.  She shared 2 weeks after the funeral is when it finally hit her that she was actually dead.  She reported she was very surprised by her response and unable to explain why she was the way she was to her daughter.  Patient went on to explain she is still having difficulty being able to relax and move forward with her current relationship.  She shared he treats her very well and is very kind to her.  She shared she has strong feelings for him but it is difficult for her to move forward with the relationship.  Patient stated she still interacts with her ex-husband who continues to put her down and is not concerned about her.  Patient stated she knows that they will not get back together but she  cannot figure out why she continues in the relationship at all.  Encouraged patient to think back to other people in her life who have treated her as her ex-husband.  Patient recognizes that her mother was very similar and she could never please her be enough for her mother.  Shared that she feels that a lot of the issues with her mother stemmed back to when she was 64 and her 30-year-old sister was killed.  Patient shared that her mother was very sad and upset and there was a disconnect between she and her mother for the rest of their lives.  Once her mother had her younger sister she became the favorite and patient felt even more disconnected.  Patient was able to recognize the connection between the 2 relationships.  Encourage patient to just start working on regular affirmations even though she does not believe them to remind herself regularly that she is enough and she deserves to be treated with kindness and respect.  Patient agreed to try to work on the affirmations as directed.  Interventions: Solution-Oriented/Positive Psychology  Diagnosis:   ICD-10-CM   1. Generalized anxiety disorder  F41.1     Plan: Patient is to utilize CBT and coping skills to decrease anxiety symptoms.  Patient is to affirm the younger parts of her that she is enough  on a regular basis.  Patient is to continue working on her self-care including diet and exercise. Long-term goal: Resolve the core conflict that is the source of anxiety Short-term goal: Identify the major life complex in the past and present that form the basis for present anxiety  [name], [facility]

## 2020-03-28 ENCOUNTER — Ambulatory Visit (INDEPENDENT_AMBULATORY_CARE_PROVIDER_SITE_OTHER): Payer: 59 | Admitting: Psychiatry

## 2020-03-28 ENCOUNTER — Other Ambulatory Visit: Payer: Self-pay

## 2020-03-28 DIAGNOSIS — F411 Generalized anxiety disorder: Secondary | ICD-10-CM

## 2020-03-28 NOTE — Progress Notes (Signed)
      [facility] Counselor/Therapist Progress Note  Patient ID: Misty Howard, MRN: [id number],    Date: 03/28/2020  Time Spent:  52 minutes 9:05 AM 9:57 AM  Treatment Type: Individual Therapy  Reported Symptoms: depression, anxiety, sleep issues, increased heart rate, fatigue  Mental Status Exam:  Appearance:   Well Groomed     Behavior:  Appropriate  Motor:  Normal  Speech/Language:   Normal Rate  Affect:  Appropriate  Mood:  anxious  Thought process:  normal  Thought content:    WNL  Sensory/Perceptual disturbances:    WNL  Orientation:  oriented to person, place, time/date and situation  Attention:  Good  Concentration:  Good  Memory:  WNL  Fund of knowledge:   Good  Insight:    Good  Judgment:   Good  Impulse Control:  Good   Risk Assessment: Danger to Self:  No Self-injurious Behavior: No Danger to Others: No Duty to Warn:no Physical Aggression / Violence:No  Access to Firearms a concern: No  Gang Involvement:No   Subjective: Patient was present for session. She shared that she is having issues with sleep.  She explained she is getting about 4 hours maybe a night and her heart rate is around 110.  She shared that her work is overwhelming currently due to them being short staffed.  Patient went on to explain the expectations are high and she feels responsibility for peoples lives.  Discussed the importance of trying to maintain perspective and trying to take things one step at a time and do all she can but realize she may have to set some limits.  Patient was redirected to last session.  She discussed more about her parents and all that she went through as a child.  Patient was able to realize that neither of her parents were able to deal with the guilt over her sister's death and each avoided their feelings in different ways.  The importance of her taking time to release the emotions now and to remind herself that she was not the problem and her parents did what they  knew to do even though it was hurtful to her.  Patient stated that she felt some relief at the end of session to see that her parents were doing what they knew to do and she does not have to carry the guilt and shame.  Patient was encouraged to realize that there is trauma work to do as she is ready.  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  Interventions: Cognitive Behavioral Therapy and Solution-Oriented/Positive Psychology  Diagnosis:   ICD-10-CM   1. Generalized anxiety disorder  F41.1     Plan: Patient is to use CBT and coping skills to decrease anxiety symptoms. Patient is to work on trying to find ways to release her emotions that are healthy for her including walking.   Long term goal: Resolve core conflict that is the source of anxiety. Short term goal: Identify the major life conflicts from the past and present that are the source of the anxiety  [name], [facility]

## 2020-04-14 ENCOUNTER — Ambulatory Visit: Payer: 59 | Admitting: Psychiatry

## 2020-04-28 ENCOUNTER — Ambulatory Visit (INDEPENDENT_AMBULATORY_CARE_PROVIDER_SITE_OTHER): Payer: 59 | Admitting: Psychiatry

## 2020-04-28 ENCOUNTER — Other Ambulatory Visit: Payer: Self-pay

## 2020-04-28 DIAGNOSIS — F411 Generalized anxiety disorder: Secondary | ICD-10-CM

## 2020-04-28 NOTE — Progress Notes (Signed)
[facility] Counselor/Therapist Progress Note  Patient ID: Misty Howard, MRN: [id number],    Date: 04/28/2020  Time Spent: 47 minutes start time 10:13 AM end time 11 AM  Treatment Type: Individual Therapy  Reported Symptoms: sadness, anxiety, frustration, sleep issues, memory issues, fatigue  Mental Status Exam:  Appearance:   Well Groomed     Behavior:  Appropriate  Motor:  Normal  Speech/Language:   Normal Rate  Affect:  Appropriate  Mood:  normal  Thought process:  normal  Thought content:    WNL  Sensory/Perceptual disturbances:    WNL  Orientation:  oriented to person, place, time/date and situation  Attention:  Good  Concentration:  Good  Memory:  fair  Fund of knowledge:   Good  Insight:    Good  Judgment:   Good  Impulse Control:  Good   Risk Assessment: Danger to Self:  No Self-injurious Behavior: No Danger to Others: No Duty to Warn:no Physical Aggression / Violence:No  Access to Firearms a concern: No  Gang Involvement:No   Subjective: Patient was present for session.  She shared that work has been very stressful.  She went on to share that her son who is an adult calls and has a temper tantrum which upsets her.  She went on to share he is trying to get disability due to her heart issues.  She went on to explain that he has a history of impulsive out of control behavior but she can't get him to take medications for his mental health issues.  Patient explained that she and her ex-husband are working hard to try and help him make positive decisions.  She shared at times that gets very overwhelming because her work is so stressful and she has to deal with so many a very difficult and delicate situations.  Patient explained she is not getting much down time or time for herself.  Discussed the importance of her self-care so that she can care for others.  Reminded patient that she has to work on her affirmations and taking time to breathe and ground herself as  needed.  Patient was encouraged to recognize when the emotions are getting to be a lot and at that point think about the core belief that she needs to recognize and remember and repeat facts to herself that confirmed that core belief.  Patient was encouraged to practice doing that exercise regularly.  Patient is also to get back to reading which is something she is always found to be very helpful and if she cannot quite do that to listen to podcasts by Dr. Tawanna Solo to work on her thoughts.  Interventions: Cognitive Behavioral Therapy and Solution-Oriented/Positive Psychology  Diagnosis:   ICD-10-CM   1. Generalized anxiety disorder  F41.1     Plan: Patient is to use CBT and coping skills to decrease anxiety symptoms.  Patient is to work on taking times just to breathe and stretch throughout the day.  Patient is to remind herself of positive core believes and think through the facts that helped them to be true.  Patient is to listen to podcasts by Dr. Tawanna Solo to help manage thoughts appropriately. Long-term goal: Resolve the core conflict that is the source of anxiety Short-term goal: Identify the major life conflicts in the past and present that form the basis for present anxiety  [name], [facility]

## 2020-05-08 ENCOUNTER — Telehealth: Payer: Self-pay | Admitting: Psychiatry

## 2020-05-08 ENCOUNTER — Ambulatory Visit (INDEPENDENT_AMBULATORY_CARE_PROVIDER_SITE_OTHER): Payer: 59 | Admitting: Psychiatry

## 2020-05-08 DIAGNOSIS — F411 Generalized anxiety disorder: Secondary | ICD-10-CM

## 2020-05-08 NOTE — Telephone Encounter (Signed)
Ms. Misty Howard, flinders are scheduled for a virtual visit with your provider today.    Just as we do with appointments in the office, we must obtain your consent to participate.  Your consent will be active for this visit and any virtual visit you may have with one of our providers in the next 365 days.    If you have a MyChart account, I can also send a copy of this consent to you electronically.  All virtual visits are billed to your insurance company just like a traditional visit in the office.  As this is a virtual visit, video technology does not allow for your provider to perform a traditional examination.  This may limit your provider's ability to fully assess your condition.  If your provider identifies any concerns that need to be evaluated in person or the need to arrange testing such as labs, EKG, etc, we will make arrangements to do so.    Although advances in technology are sophisticated, we cannot ensure that it will always work on either your end or our end.  If the connection with a video visit is poor, we may have to switch to a telephone visit.  With either a video or telephone visit, we are not always able to ensure that we have a secure connection.   I need to obtain your verbal consent now.   Are you willing to proceed with your visit today?   [name] has provided verbal consent on 05/08/2020 for a virtual visit (video or telephone).   [name], [facility] 05/08/2020  9:26 AM

## 2020-05-08 NOTE — Progress Notes (Signed)
[facility] Counselor/Therapist Progress Note  Patient ID: Misty Howard, MRN: [id number],    Date: 05/08/2020  Time Spent: 31 minutes start time 9:23 AM end time 9:54 AM Virtual Visit via Telephone Note Connected with patient by a video enabled telemedicine/telehealth application or telephone, with their informed consent, and verified patient privacy and that I am speaking with the correct person using two identifiers. I discussed the limitations, risks, security and privacy concerns of performing psychotherapy and management service by telephone and the availability of in person appointments. I also discussed with the patient that there may be a patient responsible charge related to this service. The patient expressed understanding and agreed to proceed. I discussed the treatment planning with the patient. The patient was provided an opportunity to ask questions and all were answered. The patient agreed with the plan and demonstrated an understanding of the instructions. The patient was advised to call  our office if  symptoms worsen or feel they are in a crisis state and need immediate contact.   Therapist Location: office Patient Location: home    Treatment Type: Individual Therapy  Reported Symptoms: anxiety, sleep issues, fatigue, sadness  Mental Status Exam:  Appearance:   NA     Behavior:  Sharing  Motor:  na  Speech/Language:   Normal Rate  Affect:  NA  Mood:  normal  Thought process:  normal  Thought content:    WNL  Sensory/Perceptual disturbances:    WNL  Orientation:  oriented to person, place, time/date and situation  Attention:  Good  Concentration:  Good  Memory:  WNL  Fund of knowledge:   Good  Insight:    Good  Judgment:   Good  Impulse Control:  Good   Risk Assessment: Danger to Self:  No Self-injurious Behavior: No Danger to Others: No Duty to Warn:no Physical Aggression / Violence:No  Access to Firearms a concern: No  Gang Involvement:No    Subjective: Met with patient via phone.  She fell down the steps while getting ready for session.  She shared that all she could handle was a phone visit.  She stated she was sore and bruised but still wanted to touch base. She shared she had worked 7 days in a row and than had a weekend off. She shared that she had spent some time with her ex husband.  She went on to explain that his brother is dying and he is having a hard time with it and since patient went to school with him she has been a good support.  She also shared that their son is continuing to have issues and they are supporting each other through it.  She shared that she knows that her ex is a helper but he isn't able to be a lover.  She shared that he would help everybody but he wasn't there for her which is why they ended.  Patient shared that she is realizing that it is his issue even though at the time she took it personal.  She went on to share the house that they lived in has turned into a mess and he is Ship broker. Discussed how he holds everything in, but he will talk to her about some things.  Encouraged patient to realize that he is only capable of so much and the issues that he has are not due to her.  She stated she found some old journals and as she has read them she has seen progress and  that has helped.  She has started to journal again and that has helped her to continue to get clarity. She stated she is realizing she is okay living on her own and doesn't have the desire to live with anyone again.  She shared she can be okay with them being friends and supporting each other. She went on to share that the realizations that he is there and that is enough for her.  Reminded her of the importance of telling herself that as emotions surface.  Patient was also encouraged to get checked out by a physician if she starts feeling worse physically.  Interventions: Solution-Oriented/Positive Psychology  Diagnosis:   ICD-10-CM   1. Generalized  anxiety disorder  F41.1     Plan: Patient is to use CBT and coping skills to decrease anxiety symptoms.  Patient is to continue journaling and releasing emotions through that method.  Patient is to remind herself when negative emotions surfaced that she is happy on her own and the reasons why she is happy on her own.  Patient is to go to a physician if she is starting to feel worse physically since her fall. Long-term goal: Resolve the core conflict that is the source of anxiety Short-term goal: Identify the major life complex from the past and present the form the basis for present anxiety  Misty Howard, [facility]

## 2020-05-22 ENCOUNTER — Other Ambulatory Visit: Payer: Self-pay

## 2020-05-22 ENCOUNTER — Ambulatory Visit (INDEPENDENT_AMBULATORY_CARE_PROVIDER_SITE_OTHER): Payer: 59 | Admitting: Psychiatry

## 2020-05-22 DIAGNOSIS — F411 Generalized anxiety disorder: Secondary | ICD-10-CM | POA: Diagnosis not present

## 2020-05-22 NOTE — Progress Notes (Signed)
[facility] Counselor/Therapist Progress Note  Patient ID: Misty Howard, MRN: [id number],    Date: 05/22/2020  Time Spent: 52 minutes start time 9:03 AM end time 9:55 AM  Treatment Type: Individual Therapy  Reported Symptoms: anxiety, sleep issues, sadness  Mental Status Exam:  Appearance:   Casual and Neat     Behavior:  Appropriate tearful  Motor:  Normal  Speech/Language:   Normal Rate  Affect:  Appropriate  Mood:   Anxious and sad  Thought process:  normal  Thought content:    WNL  Sensory/Perceptual disturbances:    WNL  Orientation:  oriented to person, place, time/date and situation  Attention:  Good  Concentration:  Good  Memory:  WNL  Fund of knowledge:   Good  Insight:    Good  Judgment:   Good  Impulse Control:  Good   Risk Assessment: Danger to Self:  No Self-injurious Behavior: No Danger to Others: No Duty to Warn:no Physical Aggression / Violence:No  Access to Firearms a concern: No  Gang Involvement:No   Subjective: Patient was present for session.  She shared that she is considering a new job.  She explained that she is ready to just get away.  She went on to share she dog sat her son's dog and she was able to sleep and rest well.  Discussed getting a dog sometime since it seemed to make her more relaxed.  During session patient's son called with test results concerning a heart study he had had.  Patient explained that from the study it was clear he needed a beta-blocker to help keep his heart working properly.  Shared that he currently has a pacemaker and it will only work for the next 2 years.  Patient shared she is concerned that her son will not take the medication and he is already started saying he is not can he get another pacemaker in 2 years.  Patient explained she is fearful that choosing to not do either of those options could lead to her son's death.  Patient shared that in the past that has overwhelmed her with panic when he says he will not  take his medicine or do something that is recommended by the doctors.  She shared she has been working on her perspective and realizing that he is an adult and has to make his own choices.  Acknowledged that that is a very difficult decision but appropriate.  Patient was encouraged to remind herself regularly that she is done all she can with her son and he has to make his own choices.  The importance of communicating the outcomes of his choices with him and discuss how to prepare if he chooses not to take his medication or chooses not to get any pacemaker.  Patient was encouraged to focus on her self-care and trying to set up a situation that she will be's okay despite his choices.  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  Patient was encouraged to take some time for herself today to focus on her self-care.  Interventions: Cognitive Behavioral Therapy and Solution-Oriented/Positive Psychology  Diagnosis:   ICD-10-CM   1. Generalized anxiety disorder  F41.1     Plan: Patient is to use CBT and coping skills to decrease anxiety symptoms.  Patient is to follow plans to communicate concerns with her son.  Patient is to consider different job opportunities to  see what will be best for her.  Patient is to work on her self talk and focus on the things that she can control fix and change. Long-term goal: Resolve the core conflict that is the source of anxiety Short-term goal: Identify the major life complex in the past and present the form the basis for present anxiety   Misty Howard, [employer]

## 2020-06-02 ENCOUNTER — Ambulatory Visit (INDEPENDENT_AMBULATORY_CARE_PROVIDER_SITE_OTHER): Payer: 59 | Admitting: Psychiatry

## 2020-06-02 ENCOUNTER — Other Ambulatory Visit: Payer: Self-pay

## 2020-06-02 DIAGNOSIS — F411 Generalized anxiety disorder: Secondary | ICD-10-CM | POA: Diagnosis not present

## 2020-06-02 NOTE — Progress Notes (Signed)
[facility] Counselor/Therapist Progress Note  Patient ID: Misty Howard, MRN: [id number],    Date: 06/02/2020  Time Spent: 50 minutes start time 10:11 AM end time 11:01 AM  Treatment Type: Individual Therapy  Reported Symptoms: anxiety, sleep issues  Mental Status Exam:  Appearance:   Well Groomed     Behavior:  Appropriate  Motor:  Normal  Speech/Language:   Normal Rate  Affect:  Appropriate  Mood:  normal  Thought process:  normal  Thought content:    WNL  Sensory/Perceptual disturbances:    WNL  Orientation:  oriented to person, place, time/date and situation  Attention:  Good  Concentration:  Good  Memory:  WNL  Fund of knowledge:   Good  Insight:    Good  Judgment:   Good  Impulse Control:  Good   Risk Assessment: Danger to Self:  No Self-injurious Behavior: No Danger to Others: No Duty to Warn:no Physical Aggression / Violence:No  Access to Firearms a concern: No  Gang Involvement:No   Subjective: patient was present for session.  She shared that her high school boyfriend called and they have reconnected.  She went on to share that he is married so she has set the limits with him.  Discussed different ways that she can go ahead and put limits on the relationship since she is realizing it is just not what she wants and she does not want to be involved in a secret relationship with a married man.  She went on to share that she realizes she still is in love with her husband and that is not going to change.  Discussed the fact that they seem to have a healthy relationship when they are not living together.  Patient was encouraged to recognize that she is comfortable with the way things are so she can enjoy that rather than feeling she has to have another relationship.  Patient acknowledged she will continue with her other friend but does not want anybody other than he and her husband in her life.  She shared there was one other person she will also be setting a limit  with and trying to end the relationship.  Shared that currently she is going to stay at her job at least until March to see what happens with the bonus in the potential raise.  While she is working she is going to be tried to save her money and decide what the next thing for her would be.  Patient is working many hours and is very tired.  Encouraged her to focus on her self-care so that she can continue functioning as well as she has recently.  Discussed the fact that patient is reporting progress and that treatment goals are almost met.  Agreed to start putting more distance between sessions since patient does seem to have resolved issues she wanted to in treatment.  Interventions: Cognitive Behavioral Therapy and Solution-Oriented/Positive Psychology  Diagnosis:   ICD-10-CM   1. Generalized anxiety disorder  F41.1     Plan: Patient is to use CBT and coping skills to decrease anxiety symptoms.  Patient is to follow through with plans to allay have her husband and her 1 friend  as men in her life at this time.  Patient is to continue using her self talk in her box theory when interacting with her son and reminding herself she can only control what she can do and she has to allow him to make his choices. Long-term  goal: Resolve the core conflict that is a source of anxiety Short-term goal: Identify the major life complex from the past and present the form the basis for present anxiety  [name], [facility]

## 2020-06-22 DIAGNOSIS — Z9181 History of falling: Secondary | ICD-10-CM | POA: Insufficient documentation

## 2020-07-20 ENCOUNTER — Ambulatory Visit: Payer: 59 | Admitting: Psychiatry

## 2020-07-24 ENCOUNTER — Ambulatory Visit: Payer: 59 | Admitting: Psychiatry

## 2020-07-25 ENCOUNTER — Other Ambulatory Visit: Payer: Self-pay | Admitting: Psychiatry

## 2020-07-25 DIAGNOSIS — F99 Mental disorder, not otherwise specified: Secondary | ICD-10-CM

## 2020-07-25 DIAGNOSIS — F411 Generalized anxiety disorder: Secondary | ICD-10-CM

## 2020-07-25 DIAGNOSIS — F39 Unspecified mood [affective] disorder: Secondary | ICD-10-CM

## 2020-07-25 DIAGNOSIS — F5105 Insomnia due to other mental disorder: Secondary | ICD-10-CM

## 2020-07-26 ENCOUNTER — Ambulatory Visit (INDEPENDENT_AMBULATORY_CARE_PROVIDER_SITE_OTHER): Payer: 59 | Admitting: Psychiatry

## 2020-07-26 ENCOUNTER — Other Ambulatory Visit: Payer: Self-pay

## 2020-07-26 DIAGNOSIS — F411 Generalized anxiety disorder: Secondary | ICD-10-CM

## 2020-07-26 NOTE — Progress Notes (Signed)
      [facility] Counselor/Therapist Progress Note  Patient ID: JAKYRAH HOLLADAY, MRN: [id number],    Date: 07/26/2020  Time Spent: 32 minutes start time 11:09 AM end time 11:41 AM  Treatment Type: Individual Therapy  Reported Symptoms: anxiety, triggered response, sleep issues, fatigue  Mental Status Exam:  Appearance:   Well Groomed     Behavior:  Appropriate  Motor:  Normal  Speech/Language:   Normal Rate  Affect:  Appropriate  Mood:  anxious  Thought process:  normal  Thought content:    WNL  Sensory/Perceptual disturbances:    WNL  Orientation:  oriented to person, place, time/date and situation  Attention:  Good  Concentration:  Good  Memory:  WNL  Fund of knowledge:   Good  Insight:    Good  Judgment:   Good  Impulse Control:  Good   Risk Assessment: Danger to Self:  No Self-injurious Behavior: No Danger to Others: No Duty to Warn:no Physical Aggression / Violence:No  Access to Firearms a concern: No  Gang Involvement:No   Subjective: Patient was present for session. She shared that her son continues to have tests on his heart.  Patient shared she is realizing that it is better for her to disconnect and let him come to her.  She shared she gets so anxious and overwhelmed and he seems to find pleasure in getting her agitated.  Patient was encouraged to remind herself that he is an adult and needs to make adult decisions so it is appropriate for her to allow him to come to her rather than her pursuing answers about his health.  She reported that she is still trying to figure out what to do with work because she works so many hours but she is thinking that she will to stay there till retirement because her advantages of working where she is including that the hours fit better for her.  Encouraged her to find some fun things that she can work in on the mornings or in the early afternoon before work so that she still finds joy in life since she is working all that 2 days every  14 days.  Patient shared she is continuing to have questions about her relationship with her ex-husband.  Encouraged her to write out the facts about the relationship and him to see if she can get a perspective that we will allow her to continue being in relationship with him without having expectations that he will ever change or do things differently.  Patient had to cut the session short due to previous commitment.  Interventions: Cognitive Behavioral Therapy and Solution-Oriented/Positive Psychology  Diagnosis:   ICD-10-CM   1. Generalized anxiety disorder  F41.1     Plan: Patient is to use CBT and coping skills to decrease anxiety symptoms.  Patient is to continue distancing from her son and allowing him to come to her with health information.  Patient is to find simple things to work into her mornings to have some more joy in life.  Patient is to list out all the facts concerning her ex-husband and their relationship. Long-term goal: Resolve the core conflict that is the source of anxiety Short-term goal: Identify the major life conflicts in the past and present that form the basis for present anxiety  Stevphen Meuse, [facility]

## 2020-08-25 ENCOUNTER — Ambulatory Visit: Payer: 59 | Admitting: Psychiatry

## 2020-08-28 ENCOUNTER — Other Ambulatory Visit: Payer: Self-pay | Admitting: Psychiatry

## 2020-08-28 DIAGNOSIS — F5105 Insomnia due to other mental disorder: Secondary | ICD-10-CM

## 2020-09-01 ENCOUNTER — Other Ambulatory Visit: Payer: Self-pay | Admitting: Oral Surgery

## 2020-09-12 ENCOUNTER — Other Ambulatory Visit: Payer: Self-pay

## 2020-09-12 ENCOUNTER — Encounter: Payer: Self-pay | Admitting: Psychiatry

## 2020-09-12 ENCOUNTER — Ambulatory Visit (INDEPENDENT_AMBULATORY_CARE_PROVIDER_SITE_OTHER): Payer: 59 | Admitting: Psychiatry

## 2020-09-12 DIAGNOSIS — F411 Generalized anxiety disorder: Secondary | ICD-10-CM | POA: Diagnosis not present

## 2020-09-12 DIAGNOSIS — F99 Mental disorder, not otherwise specified: Secondary | ICD-10-CM

## 2020-09-12 DIAGNOSIS — F39 Unspecified mood [affective] disorder: Secondary | ICD-10-CM

## 2020-09-12 DIAGNOSIS — F5105 Insomnia due to other mental disorder: Secondary | ICD-10-CM

## 2020-09-12 MED ORDER — FLUOXETINE HCL 20 MG PO CAPS: ORAL_CAPSULE | ORAL | 1 refills | Status: DC

## 2020-09-12 MED ORDER — LORAZEPAM 1 MG PO TABS: ORAL_TABLET | ORAL | 5 refills | Status: DC

## 2020-09-12 MED ORDER — TOPIRAMATE 100 MG PO TABS: 200.0000 mg | ORAL_TABLET | Freq: Every day | ORAL | 1 refills | Status: DC

## 2020-09-12 MED ORDER — ARIPIPRAZOLE 2 MG PO TABS: 2.0000 mg | ORAL_TABLET | Freq: Every day | ORAL | 1 refills | Status: DC

## 2020-09-12 NOTE — Progress Notes (Signed)
Misty Howard [id number] 1956-03-07 65 y.o.  Subjective:   Patient ID:  Misty Howard is a 65 y.o. (DOB Nov 22, 1955) female.  Chief Complaint:  Chief Complaint  Patient presents with  . Follow-up    H/o Anxiety, mood disturbance, and insomnia    HPI Misty Howard presents to the office today for follow-up of anxiety, mood disturbance, and insomnia. She reports that she is doing ok overall. "I don't have any highs or lows." She has been dealing with dental issues. She reports "I probably don't get enough sleep." She reports that some nights she gets only 3-4 hours of sleep. She reports that she has to awaken several times to urinate. Has been staying up later and reports that this is likely related to working evening shift for years. She reports that it is difficult waking up in time to get to work. She reports that her anxiety has been "under control" and improved with therapy. Some worry about results of biopsy. Appetite has been "ferocious" and reports wt gain. She reports that her energy is very low. She reports that her concentration is "not very good." Denies SI.   Has continued to work at the same job and reports that this is going ok.   She reports that she has significant discontinuation s/s if she runs out of it.   Recent death in the family. Enjoys her grandchildren.   Past Psychiatric Medication Trials: Prozac-taken for 20 to 30 years. Lexapro-not as effective as Prozac Paxil-severe continuation signs and symptoms Zoloft-ineffective Wellbutrin Abilify Geodon-severe daytime somnolence Ativan-taken for 20 to 30 years Topamax-helpful for mood and migraines. Lamictal Risperdal BuSpar-ineffective Sonata Ambien  AIMS   Flowsheet Row Office Visit from 01/13/2020 in [facility]  AIMS Total Score 1       Review of Systems:  Review of Systems  HENT: Positive for dental problem.        Reports that she has had a cyst on mandible and has had pain with  this.   Musculoskeletal: Negative for gait problem.  Neurological: Negative for tremors.  Psychiatric/Behavioral:       Please refer to HPI    Medications: I have reviewed the patient's current medications.  Current Outpatient Medications  Medication Sig Dispense Refill  . calcium carbonate (OS-CAL - DOSED IN MG OF ELEMENTAL CALCIUM) 1250 (500 Ca) MG tablet Take 1 tablet by mouth.    . Cyanocobalamin (VITAMIN B-12) 5000 MCG SUBL Place 1 tablet under the tongue at bedtime.    . Multiple Vitamins-Minerals (CENTRUM SILVER 50+WOMEN) TABS Take 1 tablet by mouth at bedtime.    . pantoprazole (PROTONIX) 40 MG tablet Take 40 mg by mouth at bedtime.     . TYMLOS 3120 MCG/1.56ML SOPN     . ARIPiprazole (ABILIFY) 2 MG tablet Take 1 tablet (2 mg total) by mouth daily. 90 tablet 1  . FLUoxetine (PROZAC) 20 MG capsule TAKE THREE (3) CAPSULES AT BEDTIME. 270 capsule 1  . [START ON 09/25/2020] LORazepam (ATIVAN) 1 MG tablet TAKE 2&1/2 TABLETS AT BEDTIME. 75 tablet 5  . promethazine (PHENERGAN) 25 MG tablet Take 1 tablet (25 mg total) by mouth every 8 (eight) hours as needed for up to 5 days for nausea or vomiting. 15 tablet 0  . topiramate (TOPAMAX) 100 MG tablet Take 2 tablets (200 mg total) by mouth at bedtime. 180 tablet 1   No current facility-administered medications for this visit.    Medication Side Effects: None  Allergies:  Allergies  Allergen Reactions  . Codeine Phosphate Nausea And Vomiting  . Lipitor [Atorvastatin] Other (See Comments)    Muscle pain  . Morphine And Related Other (See Comments)    Causes pt to sweat and makes her crazy  . Nsaids Other (See Comments)    Gastric bypass surgery history  . Statins Other (See Comments)    Muscle pain  . Zocor [Simvastatin] Other (See Comments)    Muscle pain    Past Medical History:  Diagnosis Date  . Anxiety   . Bipolar disorder ([place])   . Cancer ([place])   . Depression   . Esophageal ring    s/p dilation 2003  . Headache(784.0)     frequent  . Hepatic steatosis   . Hyperlipidemia   . LAE (left atrial enlargement)    per records on echo in 2008  . Migraine   . Protein calorie malnutrition ([place])     Family History  Problem Relation Age of Onset  . Arthritis Mother   . Cancer Mother        breast  . Hyperlipidemia Mother   . Depression Mother   . Alcohol abuse Father   . Arthritis Father   . Heart disease Father   . Stroke Father   . Hypertension Father   . Cancer Maternal Grandmother   . Arthritis Maternal Grandfather   . Hyperlipidemia Maternal Grandfather   . Alcohol abuse Paternal Grandfather   . Depression Sister     Social History   Socioeconomic History  . Marital status: Married    Spouse name: Not on file  . Number of children: Not on file  . Years of education: Not on file  . Highest education level: Not on file  Occupational History  . Not on file  Tobacco Use  . Smoking status: Former Research scientist (life sciences)  . Smokeless tobacco: Never Used  Substance and Sexual Activity  . Alcohol use: Yes  . Drug use: Not on file  . Sexual activity: Not on file  Other Topics Concern  . Not on file  Social History Narrative  . Not on file   Social Determinants of Health   Financial Resource Strain: Not on file  Food Insecurity: Not on file  Transportation Needs: Not on file  Physical Activity: Not on file  Stress: Not on file  Social Connections: Not on file  Intimate Partner Violence: Not on file    Past Medical History, Surgical history, Social history, and Family history were reviewed and updated as appropriate.   Please see review of systems for further details on the patient's review from today.   Objective:   Physical Exam:  There were no vitals taken for this visit.  Physical Exam Constitutional:      General: She is not in acute distress. Musculoskeletal:        General: No deformity.  Neurological:     Mental Status: She is alert and oriented to person, place, and time.      Coordination: Coordination normal.  Psychiatric:        Attention and Perception: Attention and perception normal. She does not perceive auditory or visual hallucinations.        Mood and Affect: Mood normal. Mood is not anxious or depressed. Affect is not labile, blunt, angry or inappropriate.        Speech: Speech normal.        Behavior: Behavior normal.        Thought Content: Thought content normal. Thought  content is not paranoid or delusional. Thought content does not include homicidal or suicidal ideation. Thought content does not include homicidal or suicidal plan.        Cognition and Memory: Cognition and memory normal.        Judgment: Judgment normal.     Comments: Insight intact     Lab Review:     Component Value Date/Time   NA 136 01/25/2019 0310   K 3.9 01/25/2019 0310   CL 107 01/25/2019 0310   CO2 24 01/25/2019 0310   GLUCOSE 121 (H) 01/25/2019 0310   BUN 12 01/25/2019 0310   CREATININE 0.62 01/25/2019 0310   CALCIUM 8.3 (L) 01/25/2019 0310   PROT 6.2 (L) 01/25/2019 0310   ALBUMIN 3.1 (L) 01/25/2019 0310   AST 15 01/25/2019 0310   ALT 12 01/25/2019 0310   ALKPHOS 58 01/25/2019 0310   BILITOT 0.6 01/25/2019 0310   GFRNONAA >60 01/25/2019 0310   GFRAA >60 01/25/2019 0310       Component Value Date/Time   WBC 7.2 01/25/2019 0310   RBC 3.07 (L) 01/25/2019 0310   HGB 9.3 (L) 01/25/2019 0310   HCT 30.6 (L) 01/25/2019 0310   PLT 220 01/25/2019 0310   MCV 99.7 01/25/2019 0310   MCH 30.3 01/25/2019 0310   MCHC 30.4 01/25/2019 0310   RDW 13.3 01/25/2019 0310   LYMPHSABS 0.8 01/21/2019 2331   MONOABS 0.4 01/21/2019 2331   EOSABS 0.0 01/21/2019 2331   BASOSABS 0.0 01/21/2019 2331    No results found for: POCLITH, LITHIUM   No results found for: PHENYTOIN, PHENOBARB, VALPROATE, CBMZ   .res Assessment: Plan:   Will continue current plan of care since target signs and symptoms are well controlled without any tolerability issues. Discussed that higher  doses of Topamax may potentially worsen concentration. Pt to follow-up in 6 months or sooner if clinically indicated.  Recommend continuing therapy with [name], [facility].  Patient advised to contact office with any questions, adverse effects, or acute worsening in signs and symptoms.  Zakirah was seen today for follow-up.  Diagnoses and all orders for this visit:  Insomnia due to other mental disorder Comments: Controlled with Ativan Orders: -     LORazepam (ATIVAN) 1 MG tablet; TAKE 2&1/2 TABLETS AT BEDTIME.  Generalized anxiety disorder Comments: Chronic Orders: -     FLUoxetine (PROZAC) 20 MG capsule; TAKE THREE (3) CAPSULES AT BEDTIME. -     topiramate (TOPAMAX) 100 MG tablet; Take 2 tablets (200 mg total) by mouth at bedtime.  Mood disorder (HCC) Comments: Chronic improved with Abilify Orders: -     FLUoxetine (PROZAC) 20 MG capsule; TAKE THREE (3) CAPSULES AT BEDTIME. -     ARIPiprazole (ABILIFY) 2 MG tablet; Take 1 tablet (2 mg total) by mouth daily. -     topiramate (TOPAMAX) 100 MG tablet; Take 2 tablets (200 mg total) by mouth at bedtime.     Please see After Visit Summary for patient specific instructions.  Future Appointments  Date Time Provider [place]  03/12/2021 10:00 AM Thayer Headings, PMHNP [facility] None    No orders of the defined types were placed in this encounter.   -------------------------------

## 2021-03-12 ENCOUNTER — Ambulatory Visit: Payer: 59 | Admitting: Psychiatry

## 2021-03-19 ENCOUNTER — Ambulatory Visit (INDEPENDENT_AMBULATORY_CARE_PROVIDER_SITE_OTHER): Payer: PRIVATE HEALTH INSURANCE

## 2021-03-19 ENCOUNTER — Other Ambulatory Visit: Payer: Self-pay

## 2021-03-19 ENCOUNTER — Encounter: Payer: Self-pay | Admitting: Podiatry

## 2021-03-19 ENCOUNTER — Ambulatory Visit (INDEPENDENT_AMBULATORY_CARE_PROVIDER_SITE_OTHER): Payer: PRIVATE HEALTH INSURANCE | Admitting: Podiatry

## 2021-03-19 DIAGNOSIS — M21612 Bunion of left foot: Secondary | ICD-10-CM

## 2021-03-19 DIAGNOSIS — M19072 Primary osteoarthritis, left ankle and foot: Secondary | ICD-10-CM

## 2021-03-19 DIAGNOSIS — M2012 Hallux valgus (acquired), left foot: Secondary | ICD-10-CM

## 2021-03-19 DIAGNOSIS — M21611 Bunion of right foot: Secondary | ICD-10-CM

## 2021-03-19 DIAGNOSIS — M2011 Hallux valgus (acquired), right foot: Secondary | ICD-10-CM

## 2021-03-21 NOTE — Progress Notes (Signed)
  Subjective:  Patient ID: Misty Howard, female    DOB: [birth year],  MRN: [id number]  Chief Complaint  Patient presents with   Bunions    NP bunions    65 y.o. female presents with the above complaint. History confirmed with patient.  She has had some pain for quite some time but it has been getting much worse recently.  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  Objective:  Physical Exam: warm, good capillary refill, no trophic changes or ulcerative lesions, normal DP and PT pulses, and normal sensory exam. Left Foot: bunion deformity noted and pain and crepitance with grind test of the left hallux metatarsophalangeal joint Right Foot: bunion deformity noted and its milder than the other side and does not have any arthritic symptoms  No images are attached to the encounter.  Radiographs: Multiple views x-ray of the left foot: hallux valgus deformity and she has arthritic changes of the first metatarsophalangeal joint with joint space narrowing and subchondral sclerosis Assessment:   1. Hallux valgus with bunions, left   2. Hallux valgus with bunions, right   3. Osteoarthritis of first metatarsophalangeal (MTP) joint of left foot      Plan:  Patient was evaluated and treated and all questions answered.  Discussed the etiology and treatment including surgical and non surgical treatment for painful bunions.  She has exhausted all non surgical treatment prior to this visit including shoe gear changes and padding.  She desires surgical intervention. We discussed all risks including but not limited to: pain, swelling, infection, scar, numbness which may be temporary or permanent, chronic pain, stiffness, nerve pain or damage, wound healing problems, bone healing problems including delayed or non-union and recurrence. Specifically we discussed the following procedures: First metatarsophalangeal joint arthrodesis with bone graft from the heel. Informed consent will be signed at  her presurgery planning visit in 5 weeks. Surgery will be scheduled at a mutually agreeable date. Information regarding this will be forwarded to our surgery scheduler.   Return in about 5 weeks (around 04/24/2021) for surgery planning visit for the L foot .

## 2021-04-03 ENCOUNTER — Encounter: Payer: Self-pay | Admitting: Psychiatry

## 2021-04-03 ENCOUNTER — Other Ambulatory Visit: Payer: Self-pay

## 2021-04-03 ENCOUNTER — Ambulatory Visit (INDEPENDENT_AMBULATORY_CARE_PROVIDER_SITE_OTHER): Payer: PRIVATE HEALTH INSURANCE | Admitting: Psychiatry

## 2021-04-03 DIAGNOSIS — F39 Unspecified mood [affective] disorder: Secondary | ICD-10-CM | POA: Diagnosis not present

## 2021-04-03 DIAGNOSIS — F5105 Insomnia due to other mental disorder: Secondary | ICD-10-CM | POA: Diagnosis not present

## 2021-04-03 DIAGNOSIS — F99 Mental disorder, not otherwise specified: Secondary | ICD-10-CM

## 2021-04-03 DIAGNOSIS — F411 Generalized anxiety disorder: Secondary | ICD-10-CM | POA: Diagnosis not present

## 2021-04-03 MED ORDER — ARIPIPRAZOLE 2 MG PO TABS: 2.0000 mg | ORAL_TABLET | Freq: Every day | ORAL | 1 refills | Status: DC

## 2021-04-03 MED ORDER — TOPIRAMATE 100 MG PO TABS: 200.0000 mg | ORAL_TABLET | Freq: Every day | ORAL | 1 refills | Status: DC

## 2021-04-03 MED ORDER — LORAZEPAM 1 MG PO TABS: ORAL_TABLET | ORAL | 5 refills | Status: DC

## 2021-04-03 MED ORDER — FLUOXETINE HCL 20 MG PO CAPS: ORAL_CAPSULE | ORAL | 1 refills | Status: DC

## 2021-04-03 NOTE — Progress Notes (Signed)
   04/03/21 0915  Facial and Oral Movements  Muscles of Facial Expression 0  Lips and Perioral Area 0  Jaw 0  Tongue 0  Extremity Movements  Upper (arms, wrists, hands, fingers) 0  Lower (legs, knees, ankles, toes) 0  Trunk Movements  Neck, shoulders, hips 0  Overall Severity  Severity of abnormal movements (highest score from questions above) 0  Incapacitation due to abnormal movements 0  Patient's awareness of abnormal movements (rate only patient's report) 0  AIMS Total Score  AIMS Total Score 0

## 2021-04-03 NOTE — Progress Notes (Signed)
CHANTICE COLDEN RP:[id number] 02-May-1956 65 y.o.  Subjective:   Patient ID:  Misty Howard is a 65 y.o. (DOB 1956/04/19) female.  Chief Complaint:  Chief Complaint  Patient presents with   Follow-up    H/o Anxiety, mood disturbance, and insomnia.     HPI Misty Howard presents to the office today for follow-up of anxiety, mood disturbance, and insomnia.  She changed jobs in June for an increase in pay at a [employer] where she worked before. She is working nights. Prefers night shift and reports that she sleeps well during the day. She reports that she is not happy with current job and it is physically demanding. She reports financial stress. She reports some increase in anxiety due to work situation. Denies panic attacks. Mood has been "not as good... more short with people." Reports lower frustration tolerance. She reports that has been told "I knit pick." She reports some perfectionism at work. Mood has changed since change in jobs. Denies depressed mood. She reports energy and motivation are adequate for work and on days off "I crash." Reports adequate concentration. Appetite has been good and reports weight gain, which is distressing to her. Denies SI.    Past Psychiatric Medication Trials: Prozac-taken for 20 to 30 years. Lexapro-not as effective as Prozac Paxil- severe continuation signs and symptoms Zoloft-ineffective Wellbutrin Abilify Geodon-severe daytime somnolence Ativan-taken for 20 to 30 years Topamax-helpful for mood and migraines. Lamictal Risperdal BuSpar-ineffective Sonata Ambien  AIMS    Flowsheet Row Office Visit from 04/03/2021 in [facility] Office Visit from 01/13/2020 in [facility]  AIMS Total Score 0 1        Review of Systems:  Review of Systems  Musculoskeletal:  Negative for gait problem.  Neurological:  Negative for tremors.  Psychiatric/Behavioral:         Please refer to HPI   Medications: I have reviewed the  patient's current medications.  Current Outpatient Medications  Medication Sig Dispense Refill   Cyanocobalamin (VITAMIN B-12) 5000 MCG SUBL Place 1 tablet under the tongue at bedtime.     Multiple Vitamins-Minerals (CENTRUM SILVER 50+WOMEN) TABS Take 1 tablet by mouth at bedtime.     pantoprazole (PROTONIX) 40 MG tablet Take 40 mg by mouth at bedtime.      TYMLOS 3120 MCG/1.56ML SOPN      ARIPiprazole (ABILIFY) 2 MG tablet Take 1 tablet (2 mg total) by mouth daily. 90 tablet 1   FLUoxetine (PROZAC) 20 MG capsule TAKE THREE (3) CAPSULES AT BEDTIME. 270 capsule 1   LORazepam (ATIVAN) 1 MG tablet TAKE 2&1/2 TABLETS AT BEDTIME. 75 tablet 5   promethazine (PHENERGAN) 25 MG tablet Take 1 tablet (25 mg total) by mouth every 8 (eight) hours as needed for up to 5 days for nausea or vomiting. 15 tablet 0   topiramate (TOPAMAX) 100 MG tablet Take 2 tablets (200 mg total) by mouth at bedtime. 180 tablet 1   No current facility-administered medications for this visit.    Medication Side Effects: None  Allergies:  Allergies  Allergen Reactions   Codeine Phosphate Nausea And Vomiting   Lipitor [Atorvastatin] Other (See Comments)    Muscle pain   Morphine And Related Other (See Comments)    Causes pt to sweat and makes her crazy   Nsaids Other (See Comments)    Gastric bypass surgery history   Statins Other (See Comments)    Muscle pain   Zocor [Simvastatin] Other (See Comments)  Muscle pain    Past Medical History:  Diagnosis Date   Anxiety    Bipolar disorder ([place])    Cancer ([place])    Depression    Esophageal ring    s/p dilation 2003   Gastroesophageal reflux disease 05/12/2019   Headache(784.0)    frequent   Hepatic steatosis    Hyperlipidemia    LAE (left atrial enlargement)    per records on echo in 2008   Migraine    Protein calorie malnutrition ([place])     Past Medical History, Surgical history, Social history, and Family history were reviewed and updated as appropriate.    Please see review of systems for further details on the patient's review from today.   Objective:   Physical Exam:  Wt 164 lb (74.4 kg)   BMI 27.29 kg/m   Physical Exam Constitutional:      General: She is not in acute distress. Musculoskeletal:        General: No deformity.  Neurological:     Mental Status: She is alert and oriented to person, place, and time.     Coordination: Coordination normal.  Psychiatric:        Attention and Perception: Attention and perception normal. She does not perceive auditory or visual hallucinations.        Mood and Affect: Mood normal. Mood is not anxious or depressed. Affect is not labile, blunt, angry or inappropriate.        Speech: Speech normal.        Behavior: Behavior normal.        Thought Content: Thought content normal. Thought content is not paranoid or delusional. Thought content does not include homicidal or suicidal ideation. Thought content does not include homicidal or suicidal plan.        Cognition and Memory: Cognition and memory normal.        Judgment: Judgment normal.     Comments: Insight intact    Lab Review:     Component Value Date/Time   NA 136 01/25/2019 0310   K 3.9 01/25/2019 0310   CL 107 01/25/2019 0310   CO2 24 01/25/2019 0310   GLUCOSE 121 (H) 01/25/2019 0310   BUN 12 01/25/2019 0310   CREATININE 0.62 01/25/2019 0310   CALCIUM 8.3 (L) 01/25/2019 0310   PROT 6.2 (L) 01/25/2019 0310   ALBUMIN 3.1 (L) 01/25/2019 0310   AST 15 01/25/2019 0310   ALT 12 01/25/2019 0310   ALKPHOS 58 01/25/2019 0310   BILITOT 0.6 01/25/2019 0310   GFRNONAA >60 01/25/2019 0310   GFRAA >60 01/25/2019 0310       Component Value Date/Time   WBC 7.2 01/25/2019 0310   RBC 3.07 (L) 01/25/2019 0310   HGB 9.3 (L) 01/25/2019 0310   HCT 30.6 (L) 01/25/2019 0310   PLT 220 01/25/2019 0310   MCV 99.7 01/25/2019 0310   MCH 30.3 01/25/2019 0310   MCHC 30.4 01/25/2019 0310   RDW 13.3 01/25/2019 0310   LYMPHSABS 0.8 01/21/2019  2331   MONOABS 0.4 01/21/2019 2331   EOSABS 0.0 01/21/2019 2331   BASOSABS 0.0 01/21/2019 2331    No results found for: POCLITH, LITHIUM   No results found for: PHENYTOIN, PHENOBARB, VALPROATE, CBMZ   .res Assessment: Plan:   Will continue current plan of care since target signs and symptoms are well controlled without any tolerability issues. Pt to follow-up in 6 months or sooner if clinically indicated.  Patient advised to contact office with any questions,  adverse effects, or acute worsening in signs and symptoms.  Ajsa was seen today for follow-up.  Diagnoses and all orders for this visit:  Mood disorder ([place]) -     ARIPiprazole (ABILIFY) 2 MG tablet; Take 1 tablet (2 mg total) by mouth daily. -     FLUoxetine (PROZAC) 20 MG capsule; TAKE THREE (3) CAPSULES AT BEDTIME. -     topiramate (TOPAMAX) 100 MG tablet; Take 2 tablets (200 mg total) by mouth at bedtime.  Generalized anxiety disorder -     FLUoxetine (PROZAC) 20 MG capsule; TAKE THREE (3) CAPSULES AT BEDTIME. -     topiramate (TOPAMAX) 100 MG tablet; Take 2 tablets (200 mg total) by mouth at bedtime.  Insomnia due to other mental disorder -     LORazepam (ATIVAN) 1 MG tablet; TAKE 2&1/2 TABLETS AT BEDTIME.    Please see After Visit Summary for patient specific instructions.  Future Appointments  Date Time Provider [place]  04/24/2021  8:45 AM Criselda Peaches, DPM [facility]  05/10/2021  9:15 AM Criselda Peaches, DPM [facility]  05/24/2021  9:15 AM Criselda Peaches, DPM [facility]  06/12/2021 10:45 AM Criselda Peaches, DPM [facility]  10/01/2021  8:30 AM Thayer Headings, PMHNP [facility] None    No orders of the defined types were placed in this encounter.   -------------------------------

## 2021-04-06 ENCOUNTER — Telehealth: Payer: Self-pay | Admitting: Urology

## 2021-04-06 NOTE — Telephone Encounter (Signed)
DOS - 05/04/21  HALLUX MPJ FUSION LEFT --- [id number]   [facility] EFFECTIVE DATE -    RECEIVED FAX FROM [facility] STATING THAT CPT CODE 03474 NO PRIOR AUTH IS REQUIRED, AUTH # [id number], GOOD FROM 05/04/21  - 05/04/21.

## 2021-04-24 ENCOUNTER — Other Ambulatory Visit: Payer: Self-pay

## 2021-04-24 ENCOUNTER — Ambulatory Visit (INDEPENDENT_AMBULATORY_CARE_PROVIDER_SITE_OTHER): Payer: PRIVATE HEALTH INSURANCE | Admitting: Podiatry

## 2021-04-24 DIAGNOSIS — M19072 Primary osteoarthritis, left ankle and foot: Secondary | ICD-10-CM | POA: Diagnosis not present

## 2021-04-24 DIAGNOSIS — M21612 Bunion of left foot: Secondary | ICD-10-CM | POA: Diagnosis not present

## 2021-04-24 DIAGNOSIS — M2012 Hallux valgus (acquired), left foot: Secondary | ICD-10-CM | POA: Diagnosis not present

## 2021-04-24 NOTE — Progress Notes (Signed)
  Subjective:  Patient ID: Misty Howard, female    DOB: [birth year],  MRN: [id number]  Chief Complaint  Patient presents with   Bunions    surgery planning visit for the L foot     65 y.o. female presents with the above complaint. History confirmed with patient.  Surgery scheduled for next Friday.  She is here for presurgical planning visit  Objective:  Physical Exam: warm, good capillary refill, no trophic changes or ulcerative lesions, normal DP and PT pulses, and normal sensory exam. Left Foot: bunion deformity noted and pain and crepitance with grind test of the left hallux metatarsophalangeal joint Right Foot: bunion deformity noted and its milder than the other side and does not have any arthritic symptoms  No images are attached to the encounter.  Radiographs: Multiple views x-ray of the left foot: hallux valgus deformity and she has arthritic changes of the first metatarsophalangeal joint with joint space narrowing and subchondral sclerosis Assessment:   1. Hallux valgus with bunions, left   2. Osteoarthritis of first metatarsophalangeal (MTP) joint of left foot      Plan:  Patient was evaluated and treated and all questions answered.  Discussed the etiology and treatment including surgical and non surgical treatment for painful bunions.  She has exhausted all non surgical treatment prior to this visit including shoe gear changes and padding.  She desires surgical intervention. We discussed all risks including but not limited to: pain, swelling, infection, scar, numbness which may be temporary or permanent, chronic pain, stiffness, nerve pain or damage, wound healing problems, bone healing problems including delayed or non-union and recurrence. Specifically we discussed the following procedures: First metatarsophalangeal joint arthrodesis with bone graft from the heel.  Informed consent was signed today.  I addressed all their questions.  We discussed all the possible risks of  surgery.  She is discussed with her PCP and she will be able to continue on her osteoporosis medications following surgery which I think is her biggest risk factor for complication or nonunion.   Surgical plan:  Procedure: -Left first MTPJ fusion with bone graft from ipsilateral heel  Location: -[facility]  Anesthesia plan: -IV sedation with regional block  Postoperative pain plan: - Tylenol 1000 mg every 6 hours, ibuprofen 600 mg every 6 hours, gabapentin 300 mg every 8 hours x5 days, oxycodone 5 mg 1-2 tabs every 6 hours only as needed  DVT prophylaxis: -None required  WB Restrictions / DME needs: -WBAT in CAM boot following surgery this dispensed today    No follow-ups on file.

## 2021-04-30 ENCOUNTER — Telehealth: Payer: Self-pay

## 2021-04-30 NOTE — Telephone Encounter (Signed)
Misty Howard called to cancel her surgery with Dr. Sherryle Lis on 05/04/2021. She stated she just got terminated from her job and will no longer have insurance. I told her once she gets everything situated to call up back and we can get her rescheduled.  Notified Dr. Sherryle Lis and Caren Griffins with [facility]

## 2021-04-30 NOTE — Telephone Encounter (Signed)
Thanks for letting me know!

## 2021-05-10 ENCOUNTER — Encounter: Payer: PRIVATE HEALTH INSURANCE | Admitting: Podiatry

## 2021-05-24 ENCOUNTER — Encounter: Payer: PRIVATE HEALTH INSURANCE | Admitting: Podiatry

## 2021-06-12 ENCOUNTER — Encounter: Payer: PRIVATE HEALTH INSURANCE | Admitting: Podiatry

## 2021-10-01 ENCOUNTER — Ambulatory Visit: Payer: PRIVATE HEALTH INSURANCE | Admitting: Psychiatry

## 2021-10-18 ENCOUNTER — Ambulatory Visit (INDEPENDENT_AMBULATORY_CARE_PROVIDER_SITE_OTHER): Payer: PRIVATE HEALTH INSURANCE | Admitting: Psychiatry

## 2021-10-18 ENCOUNTER — Encounter: Payer: Self-pay | Admitting: Psychiatry

## 2021-10-18 DIAGNOSIS — F39 Unspecified mood [affective] disorder: Secondary | ICD-10-CM | POA: Diagnosis not present

## 2021-10-18 DIAGNOSIS — F411 Generalized anxiety disorder: Secondary | ICD-10-CM | POA: Diagnosis not present

## 2021-10-18 DIAGNOSIS — F99 Mental disorder, not otherwise specified: Secondary | ICD-10-CM

## 2021-10-18 DIAGNOSIS — F5105 Insomnia due to other mental disorder: Secondary | ICD-10-CM | POA: Diagnosis not present

## 2021-10-18 MED ORDER — LORAZEPAM 1 MG PO TABS: ORAL_TABLET | ORAL | 5 refills | Status: DC

## 2021-10-18 MED ORDER — ARIPIPRAZOLE 2 MG PO TABS: 2.0000 mg | ORAL_TABLET | Freq: Every day | ORAL | 1 refills | Status: DC

## 2021-10-18 MED ORDER — TOPIRAMATE 100 MG PO TABS: 200.0000 mg | ORAL_TABLET | Freq: Every day | ORAL | 1 refills | Status: DC

## 2021-10-18 MED ORDER — FLUOXETINE HCL 20 MG PO CAPS: ORAL_CAPSULE | ORAL | 1 refills | Status: DC
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
## 2021-10-24 ENCOUNTER — Ambulatory Visit (INDEPENDENT_AMBULATORY_CARE_PROVIDER_SITE_OTHER): Payer: BLUE CROSS/BLUE SHIELD | Admitting: Psychiatry

## 2021-10-24 DIAGNOSIS — F411 Generalized anxiety disorder: Secondary | ICD-10-CM | POA: Diagnosis not present
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
## 2021-12-24 ENCOUNTER — Ambulatory Visit (INDEPENDENT_AMBULATORY_CARE_PROVIDER_SITE_OTHER): Payer: BLUE CROSS/BLUE SHIELD | Admitting: Psychiatry

## 2021-12-24 DIAGNOSIS — F411 Generalized anxiety disorder: Secondary | ICD-10-CM | POA: Diagnosis not present

## 2021-12-24 NOTE — Progress Notes (Signed)
[facility] Counselor/Therapist Progress Note  Patient ID: Misty Howard, MRN: [id number],    Date: 12/24/2021  Time Spent: 51 minutes start time 2:07  PM end time 2:58 PM  Treatment Type: Individual Therapy  Reported Symptoms: anxiety  Mental Status Exam:  Appearance:   Well Groomed     Behavior:  Appropriate  Motor:  Normal  Speech/Language:   Normal Rate  Affect:  Appropriate  Mood:  normal  Thought process:  normal  Thought content:    WNL  Sensory/Perceptual disturbances:    WNL  Orientation:  oriented to person, place, time/date, and situation  Attention:  Good  Concentration:  Good  Memory:  WNL  Fund of knowledge:   Good  Insight:    Good  Judgment:   Good  Impulse Control:  Good   Risk Assessment: Danger to Self:  No Self-injurious Behavior: No Danger to Others: No Duty to Warn:no Physical Aggression / Violence:No  Access to Firearms a concern: No  Gang Involvement:No   Subjective: Patient was present for session. She shared she was able to have her cremation ceremony of her sister's pictures and she found it to be very helpful for her.  She shared she feels better since than. She went on to share she is in the process of packing up her things and moving.  She is not sure where she will be moving. She reported she is doing research on it and is not sure what she is going to do. She has also been able to end the relationship with her ex finally and that has been positive. She is dating some again and that is going well.   Patient was encouraged to recognize that she and her husband did not have a healthy relationship they were bonded through trauma and he replicated the trauma from childhood and that is why she is having a hard time moving forward.  Patient became very tearful and reported that helped her realize why she had been staying in the relationship as long as she has and that she was feeling better about the fact that she had moved forward with her life.   Patient was encouraged to recognize how her current relationship is different from her relationship with her ex-husband.  Patient was able to acknowledge that it is hard for her sometimes to accept the fact that she is being treated with kindness and respect and that he actually wants to take care of her because it is so different than what she is used to.  The patient was encouraged to remind herself that she deserves to be treated with kindness and respect and love is about acceptance for some one the way they are.  Patient was also encouraged to continue going through her home and packing so that she can move forward with her life.  Interventions: Solution-Oriented/Positive Psychology and Insight-Oriented  Diagnosis:   ICD-10-CM   1. Generalized anxiety disorder  F41.1       Plan: Patient is to use CBT and coping skills to decrease anxiety symptoms.  Patient is to continue being involved in her room relationship and to remind herself that she deserves to be treated with kindness and respect and love is accepting someone for who they are.  Patient is to find simple things to work into her mornings to have some more joy in life.  Patient is to take medication as directed long-term goal: Resolve the core conflict that is the source of  anxiety Short-term goal: Identify the major life conflicts in the past and present that form the basis for present anxiety    [name], [facility]

## 2022-02-25 ENCOUNTER — Ambulatory Visit: Payer: BLUE CROSS/BLUE SHIELD | Admitting: Psychiatry

## 2022-04-17 ENCOUNTER — Other Ambulatory Visit: Payer: Self-pay | Admitting: Psychiatry

## 2022-04-17 ENCOUNTER — Encounter: Payer: Self-pay | Admitting: Psychiatry

## 2022-04-17 ENCOUNTER — Ambulatory Visit (INDEPENDENT_AMBULATORY_CARE_PROVIDER_SITE_OTHER): Payer: Medicare Other | Admitting: Psychiatry

## 2022-04-17 DIAGNOSIS — F39 Unspecified mood [affective] disorder: Secondary | ICD-10-CM | POA: Diagnosis not present

## 2022-04-17 DIAGNOSIS — F99 Mental disorder, not otherwise specified: Secondary | ICD-10-CM

## 2022-04-17 DIAGNOSIS — F5105 Insomnia due to other mental disorder: Secondary | ICD-10-CM

## 2022-04-17 DIAGNOSIS — F411 Generalized anxiety disorder: Secondary | ICD-10-CM

## 2022-04-17 MED ORDER — FLUOXETINE HCL 20 MG PO CAPS: ORAL_CAPSULE | ORAL | 1 refills | Status: DC

## 2022-04-17 MED ORDER — ARIPIPRAZOLE 5 MG PO TABS: ORAL_TABLET | ORAL | 1 refills | Status: DC

## 2022-04-17 MED ORDER — TOPIRAMATE 100 MG PO TABS: 200.0000 mg | ORAL_TABLET | Freq: Every day | ORAL | 1 refills | Status: DC

## 2022-04-17 MED ORDER — LORAZEPAM 1 MG PO TABS: ORAL_TABLET | ORAL | 5 refills | Status: DC

## 2022-04-17 NOTE — Progress Notes (Signed)
Misty Howard [id number] [birth year] 66 y.o.  Subjective:   Patient ID:  Misty Howard is a 66 y.o. (DOB [birth year]) female.  Chief Complaint:  Chief Complaint  Patient presents with   Follow-up    Anxiety, depression, insomnia    HPI Misty Howard presents to the office today for follow-up of mood disturbance, anxiety and insomnia.   She reports that she has been "doing ok." She recently transitioned to part-time at work. She plans to sell her house and move to [place], [place] where she lived for 15 years in the past. She has some friends there. She would like to find a townhouse or condo. Her goal is to move out in late October. She reports that she has been planning this for months and has a POD in her driveway since June and has been packing. She reports that her family is not happy with this decision. She reports that she is looking forward to move.   She reports some mild anxiety and this is related to transition to Medicare. She reports her new plan did not cover Abilify so she stopped it and "kind of went into a crisis... yelling at work... not sleeping." She then had to re-start it about a week and 1/2 ago. She reports that her mood has improved and "no longer yelling at people." Sleep has improved since re-starting Abilify. Occ taking Benadryl prn for insomnia. Sleep has improved since going to part-time. Appetite has been good. Energy has been low and attributes this to working third shift. Motivation has bene low. Concentration has been ok. Denies SI.   She reports that a favorite service person died of an overdose recently.   Lorazepam last filled 03/18/22 x5.   Past Psychiatric Medication Trials: Prozac-taken for 20 to 30 years. Lexapro-not as effective as Prozac Paxil- severe continuation signs and symptoms Zoloft-ineffective Wellbutrin Abilify Geodon-severe daytime somnolence Ativan-taken for 20 to 30 years Topamax-helpful for mood and  migraines. Gabapentin Lamictal Risperdal BuSpar-ineffective Sonata Ambien   AIMS    Flowsheet Row Office Visit from 04/17/2022 in [place] Office Visit from 04/03/2021 in [place] Visit from 01/13/2020 in [facility]  AIMS Total Score 3 0 1        Review of Systems:  Review of Systems  Genitourinary:        Incontinence  Musculoskeletal:  Negative for gait problem.  Neurological:  Positive for tremors.  Psychiatric/Behavioral:         Please refer to HPI    Medications: I have reviewed the patient's current medications.  Current Outpatient Medications  Medication Sig Dispense Refill   Cyanocobalamin (VITAMIN B-12) 5000 MCG SUBL Place 1 tablet under the tongue at bedtime.     gabapentin (NEURONTIN) 300 MG capsule Take 600 mg by mouth at bedtime.     LYSINE PO Take by mouth.     Multiple Vitamins-Minerals (CENTRUM SILVER 50+WOMEN) TABS Take 1 tablet by mouth at bedtime.     ARIPiprazole (ABILIFY) 5 MG tablet Take 1/2-1 tab po qd 90 tablet 1   FLUoxetine (PROZAC) 20 MG capsule TAKE THREE (3) CAPSULES AT BEDTIME. 270 capsule 1   [START ON 05/15/2022] LORazepam (ATIVAN) 1 MG tablet TAKE 2&1/2 TABLETS AT BEDTIME. 75 tablet 5   topiramate (TOPAMAX) 100 MG tablet Take 2 tablets (200 mg total) by mouth at bedtime. 180 tablet 1   No current facility-administered medications for this visit.    Medication Side Effects: Other: Tremor when  she went off abilify  Allergies:  Allergies  Allergen Reactions   Codeine Phosphate Nausea And Vomiting   Lipitor [Atorvastatin] Other (See Comments)    Muscle pain   Morphine And Related Other (See Comments)    Causes pt to sweat and makes her crazy   Nsaids Other (See Comments)    Gastric bypass surgery history   Statins Other (See Comments)    Muscle pain   Zocor [Simvastatin] Other (See Comments)    Muscle pain    Past Medical History:  Diagnosis Date   Anxiety     Bipolar disorder ([place])    Cancer ([place])    Depression    Esophageal ring    s/p dilation 2003   Gastroesophageal reflux disease 05/12/2019   Headache(784.0)    frequent   Hepatic steatosis    Hyperlipidemia    LAE (left atrial enlargement)    per records on echo in 2008   Migraine    Protein calorie malnutrition ([place])     Past Medical History, Surgical history, Social history, and Family history were reviewed and updated as appropriate.   Please see review of systems for further details on the patient's review from today.   Objective:   Physical Exam:  Wt 160 lb 3.2 oz (72.7 kg)   BMI 26.66 kg/m   Physical Exam Constitutional:      General: She is not in acute distress. Musculoskeletal:        General: No deformity.  Neurological:     Mental Status: She is alert and oriented to person, place, and time.     Coordination: Coordination normal.  Psychiatric:        Attention and Perception: Attention and perception normal. She does not perceive auditory or visual hallucinations.        Mood and Affect: Mood is not depressed. Affect is not labile, blunt, angry or inappropriate.        Speech: Speech normal.        Behavior: Behavior normal.        Thought Content: Thought content normal. Thought content is not paranoid or delusional. Thought content does not include homicidal or suicidal ideation. Thought content does not include homicidal or suicidal plan.        Cognition and Memory: Cognition and memory normal.        Judgment: Judgment normal.     Comments: Insight intact Mood presents as mildly anxious in response to recent changes     Lab Review:     Component Value Date/Time   NA 136 01/25/2019 0310   K 3.9 01/25/2019 0310   CL 107 01/25/2019 0310   CO2 24 01/25/2019 0310   GLUCOSE 121 (H) 01/25/2019 0310   BUN 12 01/25/2019 0310   CREATININE 0.62 01/25/2019 0310   CALCIUM 8.3 (L) 01/25/2019 0310   PROT 6.2 (L) 01/25/2019 0310   ALBUMIN 3.1 (L) 01/25/2019  0310   AST 15 01/25/2019 0310   ALT 12 01/25/2019 0310   ALKPHOS 58 01/25/2019 0310   BILITOT 0.6 01/25/2019 0310   GFRNONAA >60 01/25/2019 0310   GFRAA >60 01/25/2019 0310       Component Value Date/Time   WBC 7.2 01/25/2019 0310   RBC 3.07 (L) 01/25/2019 0310   HGB 9.3 (L) 01/25/2019 0310   HCT 30.6 (L) 01/25/2019 0310   PLT 220 01/25/2019 0310   MCV 99.7 01/25/2019 0310   MCH 30.3 01/25/2019 0310   MCHC 30.4 01/25/2019 0310  RDW 13.3 01/25/2019 0310   LYMPHSABS 0.8 01/21/2019 2331   MONOABS 0.4 01/21/2019 2331   EOSABS 0.0 01/21/2019 2331   BASOSABS 0.0 01/21/2019 2331    No results found for: "POCLITH", "LITHIUM"   No results found for: "PHENYTOIN", "PHENOBARB", "VALPROATE", "CBMZ"   .res Assessment: Plan:    Pt seen for 30 minutes and time spent discussing her plans to move to [facility] and transitioning care to provider in [facility]. Recommended contacting new psychiatric provider as soon as possible since there may be a 2-3 month wait to see a new provider. Discussed that records could be provided upon receipt of signed information release. Discussed that provider would also be available if needed before she transitions care to new provider.  She reports concern about Abilify possibly not being covered under new insurance plan. Discussed that generic Abilify should be affordable even without insurance through [facility]. Discussed script could be written for 5 mg 1/2-1 tab po qd and she requests this and reports that she will take 5 mg 1/2 tab po qd. Continue Prozac 60 mg po qd for anxiety and depression.  Continue Topamax 200 mg po QHS for mood and anxiety s/s.  Continue Lorazepam 2.5 mg po QHS since this has been her long-standing dose for anxiety and insomnia. She has tolerated this dose without adverse effects.  Pt to follow-up on an as needed basis.  Patient advised to contact office with any questions, adverse effects, or acute worsening in signs and symptoms.   Aanvi was seen  today for follow-up.  Diagnoses and all orders for this visit:  Mood disorder ([place]) -     ARIPiprazole (ABILIFY) 5 MG tablet; Take 1/2-1 tab po qd -     FLUoxetine (PROZAC) 20 MG capsule; TAKE THREE (3) CAPSULES AT BEDTIME. -     topiramate (TOPAMAX) 100 MG tablet; Take 2 tablets (200 mg total) by mouth at bedtime.  Generalized anxiety disorder -     FLUoxetine (PROZAC) 20 MG capsule; TAKE THREE (3) CAPSULES AT BEDTIME. -     topiramate (TOPAMAX) 100 MG tablet; Take 2 tablets (200 mg total) by mouth at bedtime.  Insomnia due to other mental disorder -     LORazepam (ATIVAN) 1 MG tablet; TAKE 2&1/2 TABLETS AT BEDTIME.     Please see After Visit Summary for patient specific instructions.  Future Appointments  Date Time Provider [place]  05/02/2022  4:00 PM [name], [facility] None    No orders of the defined types were placed in this encounter.   -------------------------------

## 2022-04-30 ENCOUNTER — Telehealth: Payer: Self-pay | Admitting: Psychiatry

## 2022-04-30 NOTE — Telephone Encounter (Signed)
Patient's daughter called and reported being concerned about her. Reported that nothing could be shared or confirmed due to no consent

## 2022-05-02 ENCOUNTER — Ambulatory Visit (INDEPENDENT_AMBULATORY_CARE_PROVIDER_SITE_OTHER): Payer: Medicare Other | Admitting: Psychiatry

## 2022-05-02 ENCOUNTER — Telehealth: Payer: Self-pay | Admitting: Psychiatry

## 2022-05-02 ENCOUNTER — Other Ambulatory Visit: Payer: Self-pay

## 2022-05-02 DIAGNOSIS — F411 Generalized anxiety disorder: Secondary | ICD-10-CM | POA: Diagnosis not present

## 2022-05-02 MED ORDER — GABAPENTIN 300 MG PO CAPS: 600.0000 mg | ORAL_CAPSULE | Freq: Every day | ORAL | 0 refills | Status: DC

## 2022-05-02 NOTE — Progress Notes (Unsigned)
[facility] Counselor/Therapist Progress Note  Patient ID: Misty Howard, MRN: [id number],    Date: 05/02/2022  Time Spent: 50 minutes start time 4:03 PM end time 5:53 PM  Treatment Type: Individual Therapy  Reported Symptoms: anxiety, sadness, triggered responses  Mental Status Exam:  Appearance:   Well Groomed     Behavior:  Appropriate  Motor:  Normal  Speech/Language:   Normal Rate  Affect:  Appropriate  Mood:  normal  Thought process:  normal  Thought content:    WNL  Sensory/Perceptual disturbances:    WNL  Orientation:  oriented to person, place, time/date, and situation  Attention:  Good  Concentration:  Good  Memory:  WNL  Fund of knowledge:   Good  Insight:    Good  Judgment:   Good  Impulse Control:  Good   Risk Assessment: Danger to Self:  No Self-injurious Behavior: No Danger to Others: No Duty to Warn:no Physical Aggression / Violence:No  Access to Firearms a concern: No  Gang Involvement:No   Subjective: Patient was present for session.  She shared that she was moving. She reported she has reached retirement age and is concerned that she would be put into a home if she stays here. She stated she is moving back to [facility] where she had good memories.  She stated she has friends there and has started reconnecting. She is trying to find a way she can live cheaper and things are cheaper there.  Patient explains that she has done lots of research on the situation and feels that it is the best option for her at this time.  She is going through her things and that is very overwhelming to home.  Let patient know that clinician had been contacted by her daughter and she expressed concerns about her moving away and her recent moving forward with everything.  Patient reported she appreciated that her daughter was concerned but that she feels she is making the best decision for herself and is comfortable with clinician contacting daughter and letting her know  that.  Consent for information will be signed after session.  Patient reported that since burning the picture of her sister and her casket she has felt much better.  She did have an incident where a friend of hers who was a Educational psychologist at [employer] she typically went to overdosed when she was pregnant.  Patient stated that that was completely overwhelming for her and she fell apart.  She shared that she feels she needs to write her a letter and burn that in the picture as she did with her sister before she goes to [place].  Patient was able to recognize to do that she is finally at a place where she is able to move forward and leave her ex-husband behind.  Patient was encouraged to continue communicating with her children about her feelings and what she feels is best for her as well as ways that they can continue to maintain a relationship when she does move away.  Interventions: Solution-Oriented/Positive Psychology and Insight-Oriented  Diagnosis:   ICD-10-CM   1. Generalized anxiety disorder  F41.1       Plan: Patient is to use CBT and coping skills to decrease anxiety symptoms.  Patient is to continue communicating with her children about her feelings as she prepares for her move.  Patient is to burn the picture of the friend that she lost as well as a letter she writes to work  prior to moving to [facility].  Patient is to find simple things to work into her mornings to have some more joy in life.  Patient is to take medication as directed long-term goal: Resolve the core conflict that is the source of anxiety Short-term goal: Identify the major life conflicts in the past and present that form the basis for present anxiety  [name], [facility]

## 2022-05-02 NOTE — Telephone Encounter (Signed)
Pt Lvm @ 2:38p. She said she is moving her script from [facility] to [place].  They do not have a script for her for Gabapentin.  She is out of meds and would like the script sent there asap.  No upcoming appts scheduled.

## 2022-05-02 NOTE — Telephone Encounter (Signed)
Rx sent 

## 2022-06-06 ENCOUNTER — Other Ambulatory Visit: Payer: Self-pay | Admitting: Psychiatry

## 2022-06-06 DIAGNOSIS — F39 Unspecified mood [affective] disorder: Secondary | ICD-10-CM

## 2022-06-06 DIAGNOSIS — F411 Generalized anxiety disorder: Secondary | ICD-10-CM

## 2022-06-18 ENCOUNTER — Other Ambulatory Visit: Payer: Self-pay | Admitting: Psychiatry

## 2022-06-18 DIAGNOSIS — F411 Generalized anxiety disorder: Secondary | ICD-10-CM

## 2022-06-18 DIAGNOSIS — F39 Unspecified mood [affective] disorder: Secondary | ICD-10-CM

## 2022-09-11 DIAGNOSIS — E559 Vitamin D deficiency, unspecified: Secondary | ICD-10-CM | POA: Diagnosis not present

## 2022-09-11 DIAGNOSIS — C189 Malignant neoplasm of colon, unspecified: Secondary | ICD-10-CM | POA: Diagnosis not present

## 2022-09-11 DIAGNOSIS — Z9884 Bariatric surgery status: Secondary | ICD-10-CM | POA: Diagnosis not present

## 2022-09-11 DIAGNOSIS — Z8781 Personal history of (healed) traumatic fracture: Secondary | ICD-10-CM | POA: Diagnosis not present

## 2022-09-11 DIAGNOSIS — K625 Hemorrhage of anus and rectum: Secondary | ICD-10-CM | POA: Diagnosis not present

## 2022-10-18 ENCOUNTER — Other Ambulatory Visit: Payer: Self-pay | Admitting: Psychiatry

## 2022-10-18 DIAGNOSIS — F5105 Insomnia due to other mental disorder: Secondary | ICD-10-CM

## 2022-10-20 NOTE — Addendum Note (Signed)
Addended by: Bonney Leitz T on: 10/20/2022 12:26 PM   Modules accepted: Orders

## 2022-10-21 MED ORDER — LORAZEPAM 1 MG PO TABS: ORAL_TABLET | ORAL | 0 refills | Status: DC

## 2022-10-22 ENCOUNTER — Other Ambulatory Visit: Payer: Self-pay

## 2022-10-22 DIAGNOSIS — F39 Unspecified mood [affective] disorder: Secondary | ICD-10-CM

## 2022-10-22 DIAGNOSIS — F411 Generalized anxiety disorder: Secondary | ICD-10-CM

## 2022-10-22 MED ORDER — FLUOXETINE HCL 20 MG PO CAPS: ORAL_CAPSULE | ORAL | 0 refills | Status: DC

## 2022-10-24 DIAGNOSIS — R062 Wheezing: Secondary | ICD-10-CM | POA: Diagnosis not present

## 2022-10-24 DIAGNOSIS — R051 Acute cough: Secondary | ICD-10-CM | POA: Diagnosis not present

## 2022-10-27 ENCOUNTER — Other Ambulatory Visit: Payer: Self-pay | Admitting: Psychiatry

## 2022-10-27 DIAGNOSIS — F39 Unspecified mood [affective] disorder: Secondary | ICD-10-CM

## 2022-10-27 DIAGNOSIS — F411 Generalized anxiety disorder: Secondary | ICD-10-CM

## 2022-10-29 ENCOUNTER — Ambulatory Visit: Payer: Medicare Other | Admitting: Psychiatry

## 2022-10-29 ENCOUNTER — Encounter: Payer: Self-pay | Admitting: Psychiatry

## 2022-10-29 DIAGNOSIS — F411 Generalized anxiety disorder: Secondary | ICD-10-CM | POA: Diagnosis not present

## 2022-10-29 DIAGNOSIS — F5105 Insomnia due to other mental disorder: Secondary | ICD-10-CM

## 2022-10-29 DIAGNOSIS — F99 Mental disorder, not otherwise specified: Secondary | ICD-10-CM | POA: Diagnosis not present

## 2022-10-29 DIAGNOSIS — F39 Unspecified mood [affective] disorder: Secondary | ICD-10-CM

## 2022-10-29 MED ORDER — FLUOXETINE HCL 20 MG PO CAPS: ORAL_CAPSULE | ORAL | 1 refills | Status: DC

## 2022-10-29 MED ORDER — LORAZEPAM 1 MG PO TABS
ORAL_TABLET | ORAL | 5 refills | Status: DC
Start: 2022-11-18 — End: 2022-12-23

## 2022-10-29 MED ORDER — ARIPIPRAZOLE 5 MG PO TABS: ORAL_TABLET | ORAL | 1 refills | Status: DC

## 2022-10-29 MED ORDER — TOPIRAMATE 100 MG PO TABS: 200.0000 mg | ORAL_TABLET | Freq: Every day | ORAL | 1 refills | Status: DC

## 2022-10-29 NOTE — Progress Notes (Signed)
[name] [id number] 21-Jun-1956 67 y.o.  Subjective:   Patient ID:  Misty Howard is a 67 y.o. (DOB 12-20-1955) female.  Chief Complaint:  Chief Complaint  Patient presents with   Follow-up    Depression, Anxiety, and insomnia    HPI Misty Howard presents to the office today for follow-up of anxiety, insomnia, and depression.  She did not move to [place] since her children did not want her to move. She bought a town home in [place] that is one level. She reports that she has not had time "to get used to it." She has friendly neighbors.   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 She iw working 8 hour shifts. She would like to retire in the next year. She reports that daughter has been "more attentive" and coming to visit about every 2 weeks. She has a female friend that she sees periodically.   She reports that her anxiety has been "a little high." She has been without Topamax. Denies panic attacks- "just real high anxiety- crying." She reports that her anxiety is improving. Denies depressed mood. She reports irritability has been "ok, I have been pretty calm about things." She reports low energy and fatigue. She reports low motivation- "I don't want to do a thing." She reports that she is not sleeping as well. She reports that she needs at least 8-9 hours a night, however she reports that she usually sleeps 5-6 hours and has difficulty falling asleep. She reports that she startles easily and is learning new noises in her new house. She reports that her appetite has ben ok. She reports that she has not been able to lose weight. She reports that her concentration is "mediocre." Denies SI.   She reports that she realized she has been without Topamax and is not sure how long she has been without it.   Lorazepam last filled 10/21/22.   Past Psychiatric Medication Trials: Prozac-taken for 20 to 30 years. Lexapro-not as effective as Prozac Paxil- severe continuation  signs and symptoms Zoloft-ineffective Wellbutrin Abilify Geodon-severe daytime somnolence Ativan-taken for 20 to 30 years Topamax-helpful for mood and migraines. Gabapentin Lamictal Risperdal BuSpar-ineffective Sonata Ambien  AIMS    Flowsheet Row Office Visit from 10/29/2022 in [facility] Office Visit from 04/17/2022 in [facility] Office Visit from 04/03/2021 in [facility] Office Visit from 01/13/2020 in [facility]  AIMS Total Score 0 3 0 1        Review of Systems:  Review of Systems  Respiratory:  Positive for cough.   Musculoskeletal:  Negative for gait problem.  Neurological:  Negative for tremors.  Psychiatric/Behavioral:         Please refer to HPI    Medications: I have reviewed the patient's current medications.  Current Outpatient Medications  Medication Sig Dispense Refill   Cyanocobalamin (VITAMIN B-12) 5000 MCG SUBL Place 1 tablet under the tongue at bedtime.     Misc Natural Products (BRAINSTRONG MEMORY SUPPORT PO) Take by mouth.     Multiple Vitamins-Minerals (CENTRUM SILVER 50+WOMEN) TABS Take 1 tablet by mouth at bedtime.     ARIPiprazole (ABILIFY) 5 MG tablet Take 1/2-1 tab po qd 90 tablet 1   FLUoxetine (PROZAC) 20 MG capsule TAKE 3 CAPSULES BY MOUTH AT BEDTIME 270 capsule 1   [START ON 11/18/2022] LORazepam (ATIVAN) 1 MG tablet TAKE 2 AND 1/2 TABLETS BY  MOUTH AT BEDTIME 75 tablet 5   topiramate (TOPAMAX) 100 MG tablet Take 2 tablets (200 mg total) by mouth at bedtime. 180 tablet 1   No current facility-administered medications for this visit.    Medication Side Effects: None  Allergies:  Allergies  Allergen Reactions   Codeine Phosphate Nausea And Vomiting   Lipitor [Atorvastatin] Other (See Comments)    Muscle pain   Morphine And Related Other (See Comments)    Causes pt to sweat and makes her crazy   Nsaids Other (See Comments)     Gastric bypass surgery history   Statins Other (See Comments)    Muscle pain   Zocor [Simvastatin] Other (See Comments)    Muscle pain    Past Medical History:  Diagnosis Date   Anxiety    Bipolar disorder    Cancer    Depression    Esophageal ring    s/p dilation 2003   Gastroesophageal reflux disease 05/12/2019   Headache(784.0)    frequent   Hepatic steatosis    Hyperlipidemia    LAE (left atrial enlargement)    per records on echo in 2008   Migraine    Protein calorie malnutrition     Past Medical History, Surgical history, Social history, and Family history were reviewed and updated as appropriate.   Please see review of systems for further details on the patient's review from today.   Objective:   Physical Exam:  There were no vitals taken for this visit.  Physical Exam Constitutional:      General: She is not in acute distress. Musculoskeletal:        General: No deformity.  Neurological:     Mental Status: She is alert and oriented to person, place, and time.     Coordination: Coordination normal.  Psychiatric:        Attention and Perception: Attention and perception normal. She does not perceive auditory or visual hallucinations.        Mood and Affect: Mood is not depressed. Affect is not labile, blunt, angry or inappropriate.        Speech: Speech normal.        Behavior: Behavior normal.        Thought Content: Thought content normal. Thought content is not paranoid or delusional. Thought content does not include homicidal or suicidal ideation. Thought content does not include homicidal or suicidal plan.        Cognition and Memory: Cognition and memory normal.        Judgment: Judgment normal.     Comments: Insight intact Mood is anxious in response to recent psychosocial changes (move, new job)     Lab Review:     Component Value Date/Time   NA 136 01/25/2019 0310   K 3.9 01/25/2019 0310   CL 107 01/25/2019 0310   CO2 24 01/25/2019 0310    GLUCOSE 121 (H) 01/25/2019 0310   BUN 12 01/25/2019 0310   CREATININE 0.62 01/25/2019 0310   CALCIUM 8.3 (L) 01/25/2019 0310   PROT 6.2 (L) 01/25/2019 0310   ALBUMIN 3.1 (L) 01/25/2019 0310   AST 15 01/25/2019 0310   ALT 12 01/25/2019 0310   ALKPHOS 58 01/25/2019 0310   BILITOT 0.6 01/25/2019 0310   GFRNONAA >60 01/25/2019 0310   GFRAA >60 01/25/2019 0310       Component Value Date/Time   WBC 7.2 01/25/2019 0310   RBC 3.07 (L) 01/25/2019 0310   HGB 9.3 (L) 01/25/2019 0310  HCT 30.6 (L) 01/25/2019 0310   PLT 220 01/25/2019 0310   MCV 99.7 01/25/2019 0310   MCH 30.3 01/25/2019 0310   MCHC 30.4 01/25/2019 0310   RDW 13.3 01/25/2019 0310   LYMPHSABS 0.8 01/21/2019 2331   MONOABS 0.4 01/21/2019 2331   EOSABS 0.0 01/21/2019 2331   BASOSABS 0.0 01/21/2019 2331    No results found for: "POCLITH", "LITHIUM"   No results found for: "PHENYTOIN", "PHENOBARB", "VALPROATE", "CBMZ"   .res Assessment: Plan:    Pt seen for 30 minutes and time spent discussing recent psychosocial changes to include selling house of 20+ years, moving, and changing jobs. She reports some recent sleep disturbance that she attributes to multiple changes. She reports that she inadvertently stopped taking Topamax at some point and would like to re-start this since it has been helpful for her anxiety in the past. Discussed that resuming Topamax may also help improve insomnia. Recommended gradually re-starting Topamax and titrating up to previous dose of 200 mg, ie. Starting with 50 mg po QHS and increasing by 50 mg every 5-7 days up to 200 mg at bedtime.  Continue Prozac 60 mg po qd for anxiety and depression.  Continue Abilify 5 mg 1/2-1 tab po qd for depression.  Continue Lorazepam 1 mg 2.5 tabs po QHS for anxiety and insomnia since she has taken this long-term for insomnia and has been unable to sleep with previous dose reduction attempts.  Pt to follow-up in 6 months or sooner if clinically indicated.   Patient advised to contact office with any questions, adverse effects, or acute worsening in signs and symptoms.   Lupita LeashDonna was seen today for follow-up.  Diagnoses and all orders for this visit:  Mood disorder -     ARIPiprazole (ABILIFY) 5 MG tablet; Take 1/2-1 tab po qd -     FLUoxetine (PROZAC) 20 MG capsule; TAKE 3 CAPSULES BY MOUTH AT BEDTIME -     topiramate (TOPAMAX) 100 MG tablet; Take 2 tablets (200 mg total) by mouth at bedtime.  Generalized anxiety disorder -     FLUoxetine (PROZAC) 20 MG capsule; TAKE 3 CAPSULES BY MOUTH AT BEDTIME -     topiramate (TOPAMAX) 100 MG tablet; Take 2 tablets (200 mg total) by mouth at bedtime.  Insomnia due to other mental disorder -     LORazepam (ATIVAN) 1 MG tablet; TAKE 2 AND 1/2 TABLETS BY MOUTH AT BEDTIME     Please see After Visit Summary for patient specific instructions.  Future Appointments  Date Time Provider Department Center  05/01/2023  8:30 AM Corie Chiquitoarter, Dealie Koelzer, PMHNP CP-CP None    No orders of the defined types were placed in this encounter.   -------------------------------

## 2022-12-23 ENCOUNTER — Other Ambulatory Visit: Payer: Self-pay | Admitting: Psychiatry

## 2022-12-23 DIAGNOSIS — F411 Generalized anxiety disorder: Secondary | ICD-10-CM

## 2022-12-23 DIAGNOSIS — F5105 Insomnia due to other mental disorder: Secondary | ICD-10-CM

## 2022-12-23 DIAGNOSIS — F39 Unspecified mood [affective] disorder: Secondary | ICD-10-CM

## 2022-12-23 MED ORDER — FLUOXETINE HCL 20 MG PO CAPS
ORAL_CAPSULE | ORAL | 1 refills | Status: DC
Start: 2022-12-23 — End: 2023-05-22

## 2022-12-23 MED ORDER — ARIPIPRAZOLE 5 MG PO TABS
ORAL_TABLET | ORAL | 1 refills | Status: DC
Start: 2022-12-23 — End: 2023-03-19

## 2022-12-30 DIAGNOSIS — B86 Scabies: Secondary | ICD-10-CM | POA: Diagnosis not present

## 2023-03-18 ENCOUNTER — Telehealth: Payer: Self-pay | Admitting: Psychiatry

## 2023-03-18 NOTE — Telephone Encounter (Signed)
Misty Howard called at 3:30 wanting to discuss a change to her Lorazepam.  appt. 10/10  Please call.

## 2023-03-18 NOTE — Telephone Encounter (Signed)
How long ago did she decrease from 2.5 mg to 2 mg? Would recommend decreasing gradually since she has taken this dose for many years and may experience benzodiazepine withdrawal if she decreases too quickly. Recommend waiting 2 weeks between dose reductions. Please let her know that script can be sent for the 0.5 mg tabs if needed to decrease by 0.25 mg increments, ie. to be able to decrease from 2 mg to 1.75 mg, etc.

## 2023-03-18 NOTE — Telephone Encounter (Signed)
Patient reporting that 2.5 1-mg tablets is too much, she is sleepy all the time. She has dropped down to just 2 mg currently and is sleeping fine, and thinks she may need to go down some more. She said this weekend she will try going to 1.5 mg and see how she does and then let us know.  FU 10/10.

## 2023-03-19 ENCOUNTER — Other Ambulatory Visit: Payer: Self-pay | Admitting: Psychiatry

## 2023-03-19 DIAGNOSIS — F39 Unspecified mood [affective] disorder: Secondary | ICD-10-CM

## 2023-03-19 NOTE — Telephone Encounter (Signed)
Patient said she reduced it about a month ago. 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 She said she has had no issues thus far.

## 2023-04-01 ENCOUNTER — Telehealth: Payer: Self-pay | Admitting: Psychiatry

## 2023-04-01 NOTE — Telephone Encounter (Signed)
Patient lvm at 10:55 requesting refill for Ativan 1mg . She needs written prescription. Ph: [phone] Appt 10/10

## 2023-04-01 NOTE — Telephone Encounter (Signed)
Patient has RF available. Notified her. Nothing is needed at this time.

## 2023-05-01 ENCOUNTER — Ambulatory Visit: Payer: Medicare Other | Admitting: Psychiatry

## 2023-05-22 ENCOUNTER — Ambulatory Visit (INDEPENDENT_AMBULATORY_CARE_PROVIDER_SITE_OTHER): Payer: Medicare Other | Admitting: Psychiatry

## 2023-05-22 ENCOUNTER — Encounter: Payer: Self-pay | Admitting: Psychiatry

## 2023-05-22 DIAGNOSIS — F99 Mental disorder, not otherwise specified: Secondary | ICD-10-CM

## 2023-05-22 DIAGNOSIS — F5105 Insomnia due to other mental disorder: Secondary | ICD-10-CM

## 2023-05-22 DIAGNOSIS — F39 Unspecified mood [affective] disorder: Secondary | ICD-10-CM | POA: Diagnosis not present

## 2023-05-22 DIAGNOSIS — F411 Generalized anxiety disorder: Secondary | ICD-10-CM

## 2023-05-22 MED ORDER — ARIPIPRAZOLE 5 MG PO TABS
ORAL_TABLET | ORAL | 1 refills | Status: DC
Start: 2023-05-22 — End: 2024-01-14

## 2023-05-22 MED ORDER — LORAZEPAM 1 MG PO TABS
2.0000 mg | ORAL_TABLET | Freq: Every day | ORAL | 5 refills | Status: DC
Start: 2023-06-03 — End: 2023-11-20

## 2023-05-22 MED ORDER — TOPIRAMATE 100 MG PO TABS
200.0000 mg | ORAL_TABLET | Freq: Every day | ORAL | 1 refills | Status: DC
Start: 2023-05-22 — End: 2023-12-31

## 2023-05-22 MED ORDER — FLUOXETINE HCL 20 MG PO CAPS
ORAL_CAPSULE | ORAL | 1 refills | Status: DC
Start: 2023-05-22 — End: 2024-01-27

## 2023-06-04 ENCOUNTER — Encounter: Payer: Self-pay | Admitting: Psychiatry

## 2023-07-24 DIAGNOSIS — H2513 Age-related nuclear cataract, bilateral: Secondary | ICD-10-CM | POA: Diagnosis not present

## 2023-07-24 DIAGNOSIS — Z9889 Other specified postprocedural states: Secondary | ICD-10-CM | POA: Diagnosis not present

## 2023-07-31 DIAGNOSIS — U071 COVID-19: Secondary | ICD-10-CM | POA: Diagnosis not present

## 2023-07-31 DIAGNOSIS — B349 Viral infection, unspecified: Secondary | ICD-10-CM | POA: Diagnosis not present

## 2023-07-31 DIAGNOSIS — R051 Acute cough: Secondary | ICD-10-CM | POA: Diagnosis not present

## 2023-08-07 ENCOUNTER — Ambulatory Visit: Payer: Medicare HMO | Admitting: Psychiatry

## 2023-08-07 DIAGNOSIS — F4323 Adjustment disorder with mixed anxiety and depressed mood: Secondary | ICD-10-CM

## 2023-08-07 NOTE — Progress Notes (Signed)
      [facility] Counselor/Therapist Progress Note  Patient ID: Misty Howard, MRN: [id number],    Date: 08/07/2023  Time Spent: 50 minutes start time 10:06 AM end time 10:56 AM  Treatment Type: Individual Therapy  Reported Symptoms: anxiety, sadness, triggered responses, rumination  Mental Status Exam:  Appearance:   Well Groomed     Behavior:  Appropriate  Motor:  Normal  Speech/Language:   Normal Rate  Affect:  Appropriate  Mood:  anxious  Thought process:  normal  Thought content:    WNL  Sensory/Perceptual disturbances:    WNL  Orientation:  oriented to person, place, time/date, and situation  Attention:  Good  Concentration:  Good  Memory:  WNL  Fund of knowledge:   Good  Insight:    Good  Judgment:   Good  Impulse Control:  Good   Risk Assessment: Danger to Self:  No Self-injurious Behavior: No Danger to Others: No Duty to Warn:no Physical Aggression / Violence:No  Access to Firearms a concern: No  Gang Involvement:No   Subjective: Patient was present for session. She shared that she did move but it has been a transition for her. She shared she has not had any help and it has been hard getting everything done. Her children did not want her to move to [facility] so she decided to move here. She shared that she changed jobs and she likes it and the people there.  She shared it bothers her that she is not settled and unpacked. It is causing anxiety frustration and sadness. She has recently started seeing someone else and it is going well so far. Her son is going to need a new Visual merchandiser in March. Her daughter is very busy with her kids, working, and going to school. She doesn't get to see 1 of her sons and his children and that is hard.  Encouraged patient to think through what she needs to do for herself and how to start unpacking 1 box at a time.  Developed a plan that she felt would be helpful and discussed ways to talk herself through it.  Also develop treatment plan and set  goals in session  Interventions: Solution-Oriented/Positive Psychology and Insight-Oriented  Diagnosis:   ICD-10-CM   1. Generalized anxiety disorder  F41.1       Plan:  Patient is to use CBT and coping skills to decrease anxiety symptoms.  Patient is to start working on trying to unpack 1 box on her day off.  Patient is to set it up so that she has a throwaway keep and give away tile to sort things out into.  Patient is to use CBT skills to talk herself through staying motivated to get things accomplished.  Patient is to find simple things to work into her mornings to have some more joy in life.  Patient is to take medication as directed    Stevphen Meuse, [facility]

## 2023-09-24 ENCOUNTER — Ambulatory Visit (INDEPENDENT_AMBULATORY_CARE_PROVIDER_SITE_OTHER): Payer: Medicare HMO | Admitting: Physician Assistant

## 2023-09-24 DIAGNOSIS — Z91199 Patient's noncompliance with other medical treatment and regimen due to unspecified reason: Secondary | ICD-10-CM

## 2023-09-24 NOTE — Progress Notes (Signed)
 No show

## 2023-10-03 DIAGNOSIS — R635 Abnormal weight gain: Secondary | ICD-10-CM | POA: Diagnosis not present

## 2023-10-30 DIAGNOSIS — K9089 Other intestinal malabsorption: Secondary | ICD-10-CM | POA: Diagnosis not present

## 2023-10-30 DIAGNOSIS — G4733 Obstructive sleep apnea (adult) (pediatric): Secondary | ICD-10-CM | POA: Diagnosis not present

## 2023-10-30 DIAGNOSIS — E519 Thiamine deficiency, unspecified: Secondary | ICD-10-CM | POA: Diagnosis not present

## 2023-10-30 DIAGNOSIS — Z6829 Body mass index (BMI) 29.0-29.9, adult: Secondary | ICD-10-CM | POA: Diagnosis not present

## 2023-10-30 DIAGNOSIS — E559 Vitamin D deficiency, unspecified: Secondary | ICD-10-CM | POA: Diagnosis not present

## 2023-10-30 DIAGNOSIS — R635 Abnormal weight gain: Secondary | ICD-10-CM | POA: Diagnosis not present

## 2023-10-30 DIAGNOSIS — Z9884 Bariatric surgery status: Secondary | ICD-10-CM | POA: Diagnosis not present

## 2023-10-30 DIAGNOSIS — E52 Niacin deficiency [pellagra]: Secondary | ICD-10-CM | POA: Diagnosis not present

## 2023-10-30 DIAGNOSIS — E785 Hyperlipidemia, unspecified: Secondary | ICD-10-CM | POA: Diagnosis not present

## 2023-11-06 DIAGNOSIS — Z6829 Body mass index (BMI) 29.0-29.9, adult: Secondary | ICD-10-CM | POA: Diagnosis not present

## 2023-11-06 DIAGNOSIS — E669 Obesity, unspecified: Secondary | ICD-10-CM | POA: Diagnosis not present

## 2023-11-20 ENCOUNTER — Telehealth: Payer: Self-pay | Admitting: Psychiatry

## 2023-11-20 ENCOUNTER — Other Ambulatory Visit: Payer: Self-pay

## 2023-11-20 DIAGNOSIS — F5105 Insomnia due to other mental disorder: Secondary | ICD-10-CM

## 2023-11-20 MED ORDER — LORAZEPAM 1 MG PO TABS: 2.0000 mg | ORAL_TABLET | Freq: Every day | ORAL | 1 refills | Status: DC

## 2023-11-20 NOTE — Telephone Encounter (Signed)
 LF 3/28. LVM to RC.

## 2023-11-20 NOTE — Telephone Encounter (Signed)
 Pt called at at 11a requesting refill of Lorazepam  to   [id number]le, [place] - [address], [place] [address] Phone: [phone]  Fax: [phone]   She is out of meds.  She said she didn't get reminder of her visit with Ammon Bales (but it shows she texted 'yes). She is a former pt of Camilo Cella and that was going to be her first visit.  She reschedule her appt to next available with Ammon Bales.  Next appt 6/25

## 2023-11-20 NOTE — Telephone Encounter (Signed)
 Notes said taking BID, but previous Rx looks like it was prescribed for 2-1/2 tablets.  Need to verify dose.

## 2023-11-20 NOTE — Telephone Encounter (Signed)
 RF was handled thru pharmacy interface.

## 2023-11-26 DIAGNOSIS — K5900 Constipation, unspecified: Secondary | ICD-10-CM | POA: Diagnosis not present

## 2023-11-26 DIAGNOSIS — E663 Overweight: Secondary | ICD-10-CM | POA: Diagnosis not present

## 2023-12-13 DIAGNOSIS — S80869A Insect bite (nonvenomous), unspecified lower leg, initial encounter: Secondary | ICD-10-CM | POA: Diagnosis not present

## 2023-12-31 ENCOUNTER — Other Ambulatory Visit: Payer: Self-pay

## 2023-12-31 DIAGNOSIS — F411 Generalized anxiety disorder: Secondary | ICD-10-CM

## 2023-12-31 DIAGNOSIS — F39 Unspecified mood [affective] disorder: Secondary | ICD-10-CM

## 2023-12-31 MED ORDER — TOPIRAMATE 100 MG PO TABS: 200.0000 mg | ORAL_TABLET | Freq: Every day | ORAL | 0 refills | Status: DC

## 2024-01-14 ENCOUNTER — Encounter: Payer: Self-pay | Admitting: Physician Assistant

## 2024-01-14 ENCOUNTER — Ambulatory Visit (INDEPENDENT_AMBULATORY_CARE_PROVIDER_SITE_OTHER): Admitting: Physician Assistant

## 2024-01-14 DIAGNOSIS — F39 Unspecified mood [affective] disorder: Secondary | ICD-10-CM

## 2024-01-14 DIAGNOSIS — F5105 Insomnia due to other mental disorder: Secondary | ICD-10-CM | POA: Diagnosis not present

## 2024-01-14 DIAGNOSIS — F411 Generalized anxiety disorder: Secondary | ICD-10-CM | POA: Diagnosis not present

## 2024-01-14 DIAGNOSIS — F99 Mental disorder, not otherwise specified: Secondary | ICD-10-CM | POA: Diagnosis not present

## 2024-01-14 MED ORDER — LORAZEPAM 1 MG PO TABS: 2.0000 mg | ORAL_TABLET | Freq: Every day | ORAL | 5 refills | Status: DC

## 2024-01-14 MED ORDER — ARIPIPRAZOLE 5 MG PO TABS: ORAL_TABLET | ORAL | 1 refills | Status: DC

## 2024-01-14 MED ORDER — TOPIRAMATE 100 MG PO TABS: 200.0000 mg | ORAL_TABLET | Freq: Every day | ORAL | 1 refills | Status: DC

## 2024-01-14 NOTE — Progress Notes (Unsigned)
 [facility] Med Check  Patient ID: Misty Howard,  MRN: [id number]  PCP: Patient, No Pcp Per  Date of Evaluation: 01/19/2024 Time spent:20 minutes  Chief Complaint:   HISTORY/CURRENT STATUS: HPI for routine med check.  She is transferring to my care from Misty Pepper, NP who is no longer with our practice.  Misty Howard states she is doing well on her current medication regimen. She is able to enjoy things.  Energy and motivation are fair to good depending on the day.  Work is going okay, she is a Medical laboratory scientific officer, working night shift.  That affects her sleep.  The Ativan  helps both anxiety and sleep.  No extreme sadness, tearfulness, or feelings of hopelessness.  ADLs and personal hygiene are normal.   No changes in concentration, making decisions, or remembering things.  Appetite has not changed.  Weight is stable.  No mania, psychosis, or delirium.  Denies suicidal or homicidal thoughts.  Denies dizziness, syncope, seizures, numbness, tingling, tremor, tics, unsteady gait, slurred speech, confusion. Denies muscle or joint pain, stiffness, or dystonia. Denies unexplained weight loss, frequent infections, or sores that heal slowly.  No polyphagia, polydipsia, or polyuria. Denies visual changes or paresthesias.   Individual Medical History/ Review of Systems: Changes? :No   Allergies: Codeine phosphate, Lipitor [atorvastatin], Morphine and codeine, Nsaids, Statins, and Zocor [simvastatin]  Current Medications:  Current Outpatient Medications:    Coenzyme Q10 (CO Q 10) 10 MG CAPS, Take by mouth. With Resveratrol, Disp: , Rfl:    Cyanocobalamin  (VITAMIN B-12) 5000 MCG SUBL, Place 1 tablet under the tongue at bedtime., Disp: , Rfl:    FLUoxetine  (PROZAC ) 20 MG capsule, TAKE 3 CAPSULES BY MOUTH AT BEDTIME, Disp: 270 capsule, Rfl: 1   Misc Natural Products (BRAINSTRONG MEMORY SUPPORT PO), Take by mouth., Disp: , Rfl:    Multiple Vitamins-Minerals (CENTRUM SILVER 50+WOMEN) TABS, Take 1 tablet by  mouth at bedtime., Disp: , Rfl:    ARIPiprazole  (ABILIFY ) 5 MG tablet, TAKE 1/2 TO 1 TABLLET BY MOUTH EVERY DAY, Disp: 90 tablet, Rfl: 1   LORazepam  (ATIVAN ) 1 MG tablet, Take 2 tablets (2 mg total) by mouth at bedtime., Disp: 60 tablet, Rfl: 5   topiramate  (TOPAMAX ) 100 MG tablet, Take 2 tablets (200 mg total) by mouth at bedtime., Disp: 180 tablet, Rfl: 1 Medication Side Effects: none  Family Medical/ Social History: Changes? No  MENTAL HEALTH EXAM:  There were no vitals taken for this visit.There is no height or weight on file to calculate BMI.  General Appearance: Casual and Well Groomed  Eye Contact:  Good  Speech:  Clear and Coherent and Normal Rate  Volume:  Normal  Mood:  Euthymic  Affect:  Congruent  Thought Process:  Goal Directed and Descriptions of Associations: Circumstantial  Orientation:  Full (Time, Place, and Person)  Thought Content: Logical   Suicidal Thoughts:  No  Homicidal Thoughts:  No  Memory:  WNL  Judgement:  Good  Insight:  Good  Psychomotor Activity:  Normal  Concentration:  Concentration: Good  Recall:  Good  Fund of Knowledge: Good  Language: Good  Assets:  Communication Skills Desire for Improvement Financial Resources/Insurance Housing Transportation Vocational/Educational  ADL's:  Intact  Cognition: WNL  Prognosis:  Good   DIAGNOSES:    ICD-10-CM   1. Generalized anxiety disorder  F41.1 topiramate  (TOPAMAX ) 100 MG tablet    2. Mood disorder (HCC)  F39 ARIPiprazole  (ABILIFY ) 5 MG tablet    topiramate  (TOPAMAX ) 100 MG tablet  3. Insomnia due to other mental disorder  F51.05 LORazepam  (ATIVAN ) 1 MG tablet   F99      Receiving Psychotherapy: Yes   RECOMMENDATIONS:   PDMP reviewed.  Ativan  filled 12/30/2023. I provided approximately 20 minutes of face to face time during this encounter, including time spent before and after the visit in records review, medical decision making, counseling pertinent to today's visit, and charting.    Avalina is doing well on current medications so no changes need to be made.  Continue Abilify  5 mg, 1/2-1 p.o. daily. Continue Prozac  20 mg, 3 p.o. daily. Continue Ativan  1 mg, 2 p.o. nightly. Continue Topamax  100 mg, 2 p.o. nightly. Continue therapy. Return in 6 months.  Misty Cooks, PA-C

## 2024-01-27 ENCOUNTER — Other Ambulatory Visit: Payer: Self-pay

## 2024-01-27 DIAGNOSIS — F39 Unspecified mood [affective] disorder: Secondary | ICD-10-CM

## 2024-01-27 DIAGNOSIS — F411 Generalized anxiety disorder: Secondary | ICD-10-CM

## 2024-01-27 MED ORDER — FLUOXETINE HCL 20 MG PO CAPS: ORAL_CAPSULE | ORAL | 1 refills | Status: DC

## 2024-03-01 DIAGNOSIS — R600 Localized edema: Secondary | ICD-10-CM | POA: Diagnosis not present

## 2024-03-01 DIAGNOSIS — M7989 Other specified soft tissue disorders: Secondary | ICD-10-CM | POA: Diagnosis not present

## 2024-03-01 DIAGNOSIS — R2241 Localized swelling, mass and lump, right lower limb: Secondary | ICD-10-CM | POA: Diagnosis not present

## 2024-03-01 DIAGNOSIS — M79671 Pain in right foot: Secondary | ICD-10-CM | POA: Diagnosis not present

## 2024-03-12 DIAGNOSIS — I890 Lymphedema, not elsewhere classified: Secondary | ICD-10-CM | POA: Diagnosis not present

## 2024-03-12 DIAGNOSIS — E882 Lipomatosis, not elsewhere classified: Secondary | ICD-10-CM | POA: Diagnosis not present

## 2024-03-12 DIAGNOSIS — R600 Localized edema: Secondary | ICD-10-CM | POA: Diagnosis not present

## 2024-04-15 ENCOUNTER — Telehealth: Payer: Self-pay | Admitting: Internal Medicine

## 2024-04-17 NOTE — Telephone Encounter (Signed)
Wrong patient entry

## 2024-04-22 DIAGNOSIS — Z0000 Encounter for general adult medical examination without abnormal findings: Secondary | ICD-10-CM | POA: Diagnosis not present

## 2024-04-22 DIAGNOSIS — R079 Chest pain, unspecified: Secondary | ICD-10-CM | POA: Diagnosis not present

## 2024-04-22 DIAGNOSIS — Z1329 Encounter for screening for other suspected endocrine disorder: Secondary | ICD-10-CM | POA: Diagnosis not present

## 2024-04-22 DIAGNOSIS — Z7689 Persons encountering health services in other specified circumstances: Secondary | ICD-10-CM | POA: Diagnosis not present

## 2024-04-22 DIAGNOSIS — E538 Deficiency of other specified B group vitamins: Secondary | ICD-10-CM | POA: Diagnosis not present

## 2024-04-22 DIAGNOSIS — Z1231 Encounter for screening mammogram for malignant neoplasm of breast: Secondary | ICD-10-CM | POA: Diagnosis not present

## 2024-04-22 DIAGNOSIS — Z13220 Encounter for screening for lipoid disorders: Secondary | ICD-10-CM | POA: Diagnosis not present

## 2024-04-29 DIAGNOSIS — R92323 Mammographic fibroglandular density, bilateral breasts: Secondary | ICD-10-CM | POA: Diagnosis not present

## 2024-04-29 DIAGNOSIS — Z1231 Encounter for screening mammogram for malignant neoplasm of breast: Secondary | ICD-10-CM | POA: Diagnosis not present

## 2024-07-23 ENCOUNTER — Ambulatory Visit (INDEPENDENT_AMBULATORY_CARE_PROVIDER_SITE_OTHER): Admitting: Physician Assistant

## 2024-07-23 ENCOUNTER — Telehealth: Payer: Self-pay | Admitting: Physician Assistant

## 2024-07-23 ENCOUNTER — Other Ambulatory Visit: Payer: Self-pay

## 2024-07-23 DIAGNOSIS — F5105 Insomnia due to other mental disorder: Secondary | ICD-10-CM

## 2024-07-23 MED ORDER — LORAZEPAM 1 MG PO TABS: 2.0000 mg | ORAL_TABLET | Freq: Every day | ORAL | 1 refills | Status: DC

## 2024-07-23 NOTE — Telephone Encounter (Signed)
 Pt requesting RF Lorazepam  [facility] [address]

## 2024-07-23 NOTE — Telephone Encounter (Signed)
"  Pended   "

## 2024-07-27 ENCOUNTER — Other Ambulatory Visit: Payer: Self-pay | Admitting: Physician Assistant

## 2024-07-27 DIAGNOSIS — F39 Unspecified mood [affective] disorder: Secondary | ICD-10-CM

## 2024-07-27 DIAGNOSIS — F411 Generalized anxiety disorder: Secondary | ICD-10-CM

## 2024-08-01 ENCOUNTER — Other Ambulatory Visit: Payer: Self-pay | Admitting: Physician Assistant

## 2024-08-01 DIAGNOSIS — F411 Generalized anxiety disorder: Secondary | ICD-10-CM

## 2024-08-01 DIAGNOSIS — F39 Unspecified mood [affective] disorder: Secondary | ICD-10-CM

## 2024-08-26 ENCOUNTER — Encounter: Payer: Self-pay | Admitting: Physician Assistant

## 2024-08-26 ENCOUNTER — Ambulatory Visit: Admitting: Physician Assistant

## 2024-08-26 DIAGNOSIS — F411 Generalized anxiety disorder: Secondary | ICD-10-CM

## 2024-08-26 DIAGNOSIS — F5105 Insomnia due to other mental disorder: Secondary | ICD-10-CM

## 2024-08-26 DIAGNOSIS — F39 Unspecified mood [affective] disorder: Secondary | ICD-10-CM

## 2024-08-26 MED ORDER — ARIPIPRAZOLE 5 MG PO TABS: ORAL_TABLET | ORAL | 1 refills | Status: AC

## 2024-08-26 MED ORDER — LORAZEPAM 1 MG PO TABS: 2.5000 mg | ORAL_TABLET | Freq: Every day | ORAL | 5 refills | Status: AC

## 2024-08-26 NOTE — Progress Notes (Signed)
 "     [facility] Med Check  Patient ID: Misty Howard,  MRN: [id number]  PCP: Patient, No Pcp Per  Date of Evaluation: 08/26/2024 Time spent:20 minutes  Chief Complaint:  Chief Complaint   Anxiety; Depression; Follow-up    HISTORY/CURRENT STATUS: HPI for 51-month med check.  She cut back to Ativan  2 mg at night several years ago because she felt too tired when she got up to go to work.  But she does not sleep as well with only taking 2 mg.  She asks if on the nights she does not have to work the next day if we could increase the dose back to 2.5 mg.  She works night shift so that makes it difficult to sleep as well.  No panic attacks.  She feels like all of her other medications are working well.  Energy and motivation are fair to good depending on the day.  Work is okay.  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  No extreme sadness, tearfulness, or feelings of hopelessness.  ADLs and personal hygiene are normal.   Denies any changes in concentration, making decisions, or remembering things.  Appetite has not changed.  Weight is stable.   No mania, delirium, AH/VH.  No SI/HI.  Individual Medical History/ Review of Systems: Changes? :No   Allergies: Codeine phosphate, Lipitor [atorvastatin], Morphine and codeine, Nsaids, Statins, and Zocor [simvastatin]  Current Medications:  Current Outpatient Medications:    Coenzyme Q10 (CO Q 10) 10 MG CAPS, Take by mouth. With Resveratrol, Disp: , Rfl:    Cyanocobalamin  (VITAMIN B-12) 5000 MCG SUBL, Place 1 tablet under the tongue at bedtime., Disp: , Rfl:    FLUoxetine  (PROZAC ) 20 MG capsule, TAKE 3 CAPSULES BY MOUTH AT BEDTIME, Disp: 270 capsule, Rfl: 0   Misc Natural Products (BRAINSTRONG MEMORY SUPPORT PO), Take by mouth., Disp: , Rfl:    Multiple Vitamins-Minerals (CENTRUM SILVER 50+WOMEN) TABS, Take 1 tablet by mouth at bedtime., Disp: , Rfl:    topiramate  (TOPAMAX ) 100 MG tablet, TAKE 2 TABLETS BY MOUTH AT BEDTIME., Disp: 180 tablet,  Rfl: 0   ARIPiprazole  (ABILIFY ) 5 MG tablet, TAKE 1/2 TO 1 TABLLET BY MOUTH EVERY DAY, Disp: 90 tablet, Rfl: 1   LORazepam  (ATIVAN ) 1 MG tablet, Take 2.5 tablets (2.5 mg total) by mouth at bedtime., Disp: 75 tablet, Rfl: 5 Medication Side Effects: none  Family Medical/ Social History: Changes? No  MENTAL HEALTH EXAM:  There were no vitals taken for this visit.There is no height or weight on file to calculate BMI.  General Appearance: Casual and Well Groomed  Eye Contact:  Good  Speech:  Clear and Coherent and Normal Rate  Volume:  Normal  Mood:  Euthymic  Affect:  Congruent  Thought Process:  Goal Directed and Descriptions of Associations: Circumstantial  Orientation:  Full (Time, Place, and Person)  Thought Content: Logical   Suicidal Thoughts:  No  Homicidal Thoughts:  No  Memory:  WNL  Judgement:  Good  Insight:  Good  Psychomotor Activity:  Normal  Concentration:  Concentration: Good  Recall:  Good  Fund of Knowledge: Good  Language: Good  Assets:  Communication Skills Desire for Improvement Financial Resources/Insurance Housing Resilience Transportation Vocational/Educational  ADL's:  Intact  Cognition: WNL  Prognosis:  Good   PCP follows labs.   DIAGNOSES:    ICD-10-CM   1. Mood disorder  F39 ARIPiprazole  (ABILIFY ) 5 MG tablet    2. Generalized anxiety disorder  F41.1  3. Insomnia due to other mental disorder  F51.05 LORazepam  (ATIVAN ) 1 MG tablet   F99      Receiving Psychotherapy: Yes   RECOMMENDATIONS:   PDMP reviewed.  Ativan  filled 07/23/2024. I provided approximately 20  minutes of face to face time during this encounter, including time spent before and after the visit in records review, medical decision making, counseling pertinent to today's visit, and charting.   It is fine to increase the Ativan  back to 2.5 mg on the nights that she does not have to work the next day.  Otherwise no changes.  Continue Abilify  5 mg, 1/2-1 p.o.  daily. Continue Prozac  20 mg, 3 p.o. daily. Continue Ativan  1 mg, 2-2.5 pills p.o. nightly. Continue Topamax  100 mg, 2 p.o. nightly. Continue therapy. Return in 9 months.  Verneita Cooks, PA-C  "

## 2025-05-26 ENCOUNTER — Ambulatory Visit (INDEPENDENT_AMBULATORY_CARE_PROVIDER_SITE_OTHER): Admitting: Physician Assistant
